# Patient Record
Sex: Male | Born: 1975 | Race: Black or African American | Hispanic: No | Marital: Married | Smoking: Current every day smoker
Health system: Southern US, Community
[De-identification: ages and names within clinical notes are randomized; demographics above are authoritative.]

## PROBLEM LIST (undated history)

## (undated) DIAGNOSIS — M7918 Myalgia, other site: Secondary | ICD-10-CM

## (undated) DIAGNOSIS — I10 Essential (primary) hypertension: Secondary | ICD-10-CM

## (undated) DIAGNOSIS — E7800 Pure hypercholesterolemia, unspecified: Secondary | ICD-10-CM

## (undated) DIAGNOSIS — E119 Type 2 diabetes mellitus without complications: Secondary | ICD-10-CM

## (undated) HISTORY — PX: WISDOM TOOTH EXTRACTION: SHX21

## (undated) HISTORY — PX: OTHER SURGICAL HISTORY: SHX169

## (undated) HISTORY — DX: Myalgia, other site: M79.18

---

## 2001-01-01 ENCOUNTER — Emergency Department (HOSPITAL_COMMUNITY): Admission: EM | Admit: 2001-01-01 | Discharge: 2001-01-01 | Payer: Self-pay | Admitting: Internal Medicine

## 2001-01-04 ENCOUNTER — Emergency Department (HOSPITAL_COMMUNITY): Admission: EM | Admit: 2001-01-04 | Discharge: 2001-01-04 | Payer: Self-pay | Admitting: Emergency Medicine

## 2001-01-09 ENCOUNTER — Emergency Department (HOSPITAL_COMMUNITY): Admission: EM | Admit: 2001-01-09 | Discharge: 2001-01-09 | Payer: Self-pay | Admitting: Emergency Medicine

## 2001-01-19 ENCOUNTER — Emergency Department (HOSPITAL_COMMUNITY): Admission: EM | Admit: 2001-01-19 | Discharge: 2001-01-19 | Payer: Self-pay | Admitting: Emergency Medicine

## 2001-01-19 ENCOUNTER — Encounter: Payer: Self-pay | Admitting: Emergency Medicine

## 2001-02-01 ENCOUNTER — Encounter: Admission: RE | Admit: 2001-02-01 | Discharge: 2001-02-01 | Payer: Self-pay | Admitting: Otolaryngology

## 2001-02-01 ENCOUNTER — Encounter: Payer: Self-pay | Admitting: Otolaryngology

## 2001-02-04 ENCOUNTER — Inpatient Hospital Stay (HOSPITAL_COMMUNITY): Admission: RE | Admit: 2001-02-04 | Discharge: 2001-02-07 | Payer: Self-pay | Admitting: Otolaryngology

## 2001-02-04 ENCOUNTER — Encounter: Payer: Self-pay | Admitting: Otolaryngology

## 2005-03-12 ENCOUNTER — Emergency Department (HOSPITAL_COMMUNITY): Admission: EM | Admit: 2005-03-12 | Discharge: 2005-03-12 | Payer: Self-pay | Admitting: Emergency Medicine

## 2005-09-05 ENCOUNTER — Emergency Department (HOSPITAL_COMMUNITY): Admission: EM | Admit: 2005-09-05 | Discharge: 2005-09-05 | Payer: Self-pay | Admitting: Emergency Medicine

## 2009-12-01 ENCOUNTER — Emergency Department (HOSPITAL_COMMUNITY): Admission: EM | Admit: 2009-12-01 | Discharge: 2009-12-01 | Payer: Self-pay | Admitting: Emergency Medicine

## 2010-05-23 ENCOUNTER — Emergency Department (HOSPITAL_COMMUNITY): Admission: EM | Admit: 2010-05-23 | Discharge: 2010-05-23 | Payer: Self-pay | Admitting: Family Medicine

## 2010-06-18 ENCOUNTER — Ambulatory Visit (HOSPITAL_COMMUNITY): Admission: RE | Admit: 2010-06-18 | Discharge: 2010-06-18 | Payer: Self-pay | Admitting: Family Medicine

## 2010-06-26 ENCOUNTER — Encounter
Admission: RE | Admit: 2010-06-26 | Discharge: 2010-08-05 | Payer: Self-pay | Source: Home / Self Care | Admitting: Internal Medicine

## 2010-11-11 ENCOUNTER — Other Ambulatory Visit (HOSPITAL_COMMUNITY): Payer: Self-pay | Admitting: Family Medicine

## 2010-11-11 DIAGNOSIS — R52 Pain, unspecified: Secondary | ICD-10-CM

## 2010-11-11 DIAGNOSIS — R262 Difficulty in walking, not elsewhere classified: Secondary | ICD-10-CM

## 2010-11-18 ENCOUNTER — Ambulatory Visit (HOSPITAL_COMMUNITY)
Admission: RE | Admit: 2010-11-18 | Discharge: 2010-11-18 | Disposition: A | Discharge status: Home / Self Care | Payer: Self-pay | Source: Ambulatory Visit | Attending: Family Medicine | Admitting: Family Medicine

## 2010-11-18 DIAGNOSIS — D1779 Benign lipomatous neoplasm of other sites: Secondary | ICD-10-CM | POA: Insufficient documentation

## 2010-11-18 DIAGNOSIS — M545 Low back pain, unspecified: Secondary | ICD-10-CM | POA: Insufficient documentation

## 2010-11-18 DIAGNOSIS — R52 Pain, unspecified: Secondary | ICD-10-CM

## 2010-11-18 DIAGNOSIS — R262 Difficulty in walking, not elsewhere classified: Secondary | ICD-10-CM

## 2010-11-18 MED ORDER — GADOBENATE DIMEGLUMINE 529 MG/ML IV SOLN
20.0000 mL | Freq: Once | INTRAVENOUS | Status: AC
  Administered 2010-11-18: 20 mL via INTRAVENOUS

## 2011-01-24 NOTE — Op Note (Signed)
[place]. [facility]  Patient:    Antonio Huber, Antonio Huber                   MRN: [id number] Proc. Date: 02/04/01 Adm. Date:  [id number] Attending:  Lucky Cowboy CC:         G[place]. Yetta Barre, M.D.   Operative Report  PREOPERATIVE DIAGNOSIS:  Right neck abscess.  POSTOPERATIVE DIAGNOSIS:  Right neck abscess.  PROCEDURE:  Incision and drainage of right neck abscess.  SURGEON:  Lucky Cowboy, M.D.  ANESTHESIA:  General endotracheal anesthesia.  ESTIMATED BLOOD LOSS:  5 cc.  SPECIMENS:  Culture from right neck abscess cavity.  COMPLICATIONS:  None.  INDICATIONS:  This patient is a 35 year old male who has been treated with antibiotics for hidradenitis suppurativa.  He has had cellulitis and microabscesses in the right neck.  Repeat CT scan revealed coalescence into a 3 cm abscess cavity.  For this reason, he is taken to the operating room for drainage.  FINDINGS:  Patient was noted to have a large abscess cavity within the right sternocleidomastoid and posterior neck.  Cultures were obtained.  DESCRIPTION OF PROCEDURE:  The patient was taken to the operating room and placed on the table in the supine position.   He was then placed under general endotracheal anesthesia and the right neck prepped with Betadine and draped in the usual sterile fashion.  A transverse 2 cm incision was made with a #15 blade.  Bovie cautery was used to divide the platysma muscle.  Blunt dissection down into the abscess cavity was performed using a hemostat.  Pus was delivered deep in the abscess cavity.  Cultures, both aerobic and anaerobic, were obtained.  A finger dissection was used to ensure continuity with the entire abscess cavity.  Copious irrigation with normal saline was performed.  A half-inch Penrose placed in the depths of the wound and secured to the skin in a simple interrupted fashion using 4-0 Prolene.  Bacitracin ointment was placed on the wound.  Next, a dressing  using Kerlix was then applied.  The patient was awakened from the anesthesia and taken to the postanesthesia care unit in stable condition.  There were no complications. DD:  02/06/01 TD:  02/06/01 Job: [id number]

## 2011-01-24 NOTE — Discharge Summary (Signed)
[place]. [facility]  Patient:    TURKI, TAPANES                   MRN: [id number] Adm. Date:  [id number] Disc. Date: [id number] Attending:  Lucky Cowboy CC:         [facility]  Nadara Mustard, M.D.   Discharge Summary  DISCHARGE DIAGNOSES: 1. Right neck abscess secondary to hydradenitis suppurativa. 2. Right knee effusion.  PROCEDURE:  Incision and drainage of right neck abscess.  Aspiration of right knee fluid.  HOSPITAL COURSE:  The patient was admitted after being treated with high dose oral antibiotic therapy and the knowledge of a phlegmon in the right posterior and midneck.  This was related to hydradenitis suppurativa.  The patient was followed in the office with repeat CT scan demonstrating well circumscribed abscess cavity.  For this reason, the patient was taken to the operating room where incision and drainage of the right neck abscess was performed.  The patient was noted to have a large abscess cavity within the right sternocleidomastoid muscle in the posterior neck.  Cultures were obtained, but did not grow bacteria.  There was prominence of white blood cells.  The patient was admitted and treated with IV Unasyn.  He was left with a drain in place to continue to drain abscess and cellulitic fluids.  The drain was removed on postoperative day #3.  There was minimal serosanguineous drainage. The patient initially did spike a postoperative fever of 101.5 which decreased on postoperative day #2.  While in the hospital, the patient developed some swelling of the right knee. He did recall a fall approximately three weeks prior.  Dr. Lajoyce Corners was consulted who performed aspiration of right knee effusion.  The white blood cell count was normal as was the differential.  The patient was discharged to home in stable condition.  DISCHARGE MEDICATIONS: 1. Augmentin 875 mg b.i.d. for seven days. 2. Darvocet-N 100 one to two  q.4-6h. p.r.n. pain.  ACTIVITY:  He was instructed to not do anything strenuous.  He was also instructed to keep the right neck incision site dry and to apply bacitracin ointment two to three times per day.  FOLLOW-UP:  He was instructed to contact Dr. Burna Sis office for redness or increase in pain in the right knee.  An appointment with Dr. Gerilyn Pilgrim in one week. DD:  02/24/01 TD:  02/24/01 Job: [id number]

## 2011-06-09 ENCOUNTER — Encounter: Payer: Self-pay | Attending: Neurosurgery | Admitting: Neurosurgery

## 2011-06-09 DIAGNOSIS — M25529 Pain in unspecified elbow: Secondary | ICD-10-CM

## 2011-06-09 DIAGNOSIS — M545 Low back pain, unspecified: Secondary | ICD-10-CM | POA: Insufficient documentation

## 2011-06-09 DIAGNOSIS — M25569 Pain in unspecified knee: Secondary | ICD-10-CM | POA: Insufficient documentation

## 2011-06-09 DIAGNOSIS — M25519 Pain in unspecified shoulder: Secondary | ICD-10-CM | POA: Insufficient documentation
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
## 2012-06-01 ENCOUNTER — Ambulatory Visit: Payer: Self-pay | Admitting: Family Medicine

## 2012-06-07 ENCOUNTER — Ambulatory Visit (INDEPENDENT_AMBULATORY_CARE_PROVIDER_SITE_OTHER): Payer: Self-pay | Admitting: Family Medicine

## 2012-06-07 VITALS — BP 141/91 | HR 102 | Temp 97.9°F | Ht 65.0 in | Wt 266.0 lb

## 2012-06-07 DIAGNOSIS — G8929 Other chronic pain: Secondary | ICD-10-CM

## 2012-06-07 DIAGNOSIS — E669 Obesity, unspecified: Secondary | ICD-10-CM | POA: Insufficient documentation

## 2012-06-07 DIAGNOSIS — R350 Frequency of micturition: Secondary | ICD-10-CM

## 2012-06-07 DIAGNOSIS — I10 Essential (primary) hypertension: Secondary | ICD-10-CM

## 2012-06-07 DIAGNOSIS — M549 Dorsalgia, unspecified: Secondary | ICD-10-CM

## 2012-06-07 DIAGNOSIS — M25519 Pain in unspecified shoulder: Secondary | ICD-10-CM

## 2012-06-07 MED ORDER — MELOXICAM 7.5 MG PO TABS: 7.5000 mg | ORAL_TABLET | Freq: Every day | ORAL | Status: DC

## 2012-06-07 MED ORDER — TRAMADOL HCL 50 MG PO TABS: 50.0000 mg | ORAL_TABLET | Freq: Three times a day (TID) | ORAL | Status: DC | PRN

## 2012-06-07 MED ORDER — CYCLOBENZAPRINE HCL 10 MG PO TABS: 10.0000 mg | ORAL_TABLET | Freq: Three times a day (TID) | ORAL | Status: DC | PRN

## 2012-06-07 NOTE — Patient Instructions (Addendum)
Thank you for coming into clinic today Please continue taking your medications as prescribed Please continue to apply for the orange card After you are approved come back for blood work and another appointment Please consider a joint injection Please consider physical therapy

## 2012-06-11 ENCOUNTER — Encounter: Payer: Self-pay | Admitting: Family Medicine

## 2012-06-11 DIAGNOSIS — M25519 Pain in unspecified shoulder: Secondary | ICD-10-CM | POA: Insufficient documentation

## 2012-06-11 NOTE — Assessment & Plan Note (Signed)
No musculoskeletal abnormality noted. Significant previous imaging WILL NOT RX CHRONIC HIGH DOSE PAIN MEDS FOR THIS CONDITION Likely secondary to obesity Flexeril adn wt loss Refill tramadol at this time due to multiple musc complaints but no vicodin

## 2012-06-11 NOTE — Assessment & Plan Note (Addendum)
Concern for Diabetes WIll obtain urine and blood work after obtaining orange card per pt decision. Warning signs of hyperglycemia/hypoglycemia reviewed CMET, UA, A1c ordered and to be done prior to next appt
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
## 2012-06-11 NOTE — Assessment & Plan Note (Signed)
Elevated today in clinic Recheck and next appt Pt to check at home

## 2012-06-11 NOTE — Assessment & Plan Note (Addendum)
Pt not exercising and w/ poor nutrition. Pt to try and make lifestyle chages Lipid panel prior to next appt

## 2012-06-11 NOTE — Assessment & Plan Note (Addendum)
No bony abnormality Imaging reviewed and noted above Pt physical exam not consistent w/ history  WILL NOT RX LONGTERM HIGH DOSE NARCOTICS Refill tramadol and meloxicam for now.  Start flexeril Will consider injection if persists Pt to start PT once has orange card.  DO NOT BELIEVE PT WOULD MEET DISABILITY DUE TO COMPLAINT

## 2014-01-20 ENCOUNTER — Emergency Department (HOSPITAL_COMMUNITY)
Admission: EM | Admit: 2014-01-20 | Discharge: 2014-01-20 | Disposition: A | Discharge status: Home / Self Care | Payer: No Typology Code available for payment source | Attending: Emergency Medicine | Admitting: Emergency Medicine

## 2014-01-20 ENCOUNTER — Encounter (HOSPITAL_COMMUNITY): Payer: Self-pay | Admitting: Emergency Medicine

## 2014-01-20 DIAGNOSIS — J351 Hypertrophy of tonsils: Secondary | ICD-10-CM | POA: Insufficient documentation

## 2014-01-20 DIAGNOSIS — T783XXA Angioneurotic edema, initial encounter: Secondary | ICD-10-CM | POA: Insufficient documentation

## 2014-01-20 DIAGNOSIS — T44905A Adverse effect of unspecified drugs primarily affecting the autonomic nervous system, initial encounter: Secondary | ICD-10-CM | POA: Insufficient documentation

## 2014-01-20 DIAGNOSIS — Z791 Long term (current) use of non-steroidal anti-inflammatories (NSAID): Secondary | ICD-10-CM | POA: Insufficient documentation

## 2014-01-20 DIAGNOSIS — F17200 Nicotine dependence, unspecified, uncomplicated: Secondary | ICD-10-CM | POA: Insufficient documentation

## 2014-01-20 LAB — RAPID STREP SCREEN (MED CTR MEBANE ONLY): Streptococcus, Group A Screen (Direct): NEGATIVE

## 2014-01-20 MED ORDER — SODIUM CHLORIDE 0.9 % IV SOLN
Freq: Once | INTRAVENOUS | Status: AC
  Administered 2014-01-20: 1000 mL/h via INTRAVENOUS

## 2014-01-20 MED ORDER — DEXAMETHASONE SODIUM PHOSPHATE 10 MG/ML IJ SOLN
10.0000 mg | Freq: Once | INTRAMUSCULAR | Status: AC
  Administered 2014-01-20: 10 mg via INTRAVENOUS
  Filled 2014-01-20: qty 1

## 2014-01-20 NOTE — ED Notes (Signed)
Medication patient takes are Meloxicam, Lisinopril, Hydrocodone - apap, and cyclobenzaprine

## 2014-01-20 NOTE — ED Notes (Addendum)
He was started on lisinopril 2 days ago. He took his dose this am at 0900 then took a nap when he woke up he felt like his "throat was swollen and closing." He states he can not clear his throat or cough. He is breathing easily on room air during triage
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
## 2014-01-20 NOTE — Discharge Instructions (Signed)
As discussed, it is important that you stop taking the lisinopril.  Followup with your physician for further evaluation and management, particularly in terms of your blood pressure issues.  You may also consider following up with physical therapy for additional assistance with your shoulder and back pain.  [facility] [phone] [address]   Angioedema Angioedema is a sudden swelling of tissues, often of the skin. It can occur on the face or genitals or in the abdomen or other body parts. The swelling usually develops over a short period and gets better in 24 to 48 hours. It often begins during the night and is found when the person wakes up. The person may also get red, itchy patches of skin (hives). Angioedema can be dangerous if it involves swelling of the air passages.  Depending on the cause, episodes of angioedema may only happen once, come back in unpredictable patterns, or repeat for several years and then gradually fade away.  CAUSES  Angioedema can be caused by an allergic reaction to various triggers. It can also result from nonallergic causes, including reactions to drugs, immune system disorders, viral infections, or an abnormal gene that is passed to you from your parents (hereditary). For some people with angioedema, the cause is unknown.  Some things that can trigger angioedema include:   Foods.   Medicines, such as ACE inhibitors, ARBs, nonsteroidal anti-inflammatory agents, or estrogen.   Latex.   Animal saliva.   Insect stings.   Dyes used in X-rays.   Mild injury.   Dental work.  Surgery.  Stress.   Sudden changes in temperature.   Exercise. SIGNS AND SYMPTOMS   Swelling of the skin.  Hives. If these are present, there is also intense itching.  Redness in the affected area.   Pain in the affected area.  Swollen lips or tongue.  Breathing problems. This may happen if the air passages swell.  Wheezing. If  internal organs are involved, there may be:   Nausea.   Abdominal pain.   Vomiting.   Difficulty swallowing.   Difficulty passing urine. DIAGNOSIS   Your health care provider will examine the affected area and take a medical and family history.  Various tests may be done to help determine the cause. Tests may include:  Allergy skin tests to see if the problem is an allergic reaction.   Blood tests to check for hereditary angioedema.   Tests to check for underlying diseases that could cause the condition.   A review of your medicines, including over the counter medicines, may be done. TREATMENT  Treatment will depend on the cause of the angioedema. Possible treatments include:   Removal of anything that triggered the condition (such as stopping certain medicines).   Medicines to treat symptoms or prevent attacks. Medicines given may include:   Antihistamines.   Epinephrine injection.   Steroids.   Hospitalization may be required for severe attacks. If the air passages are affected, it can be an emergency. Tubes may need to be placed to keep the airway open. HOME CARE INSTRUCTIONS   Only take over-the-counter or prescription medicines as directed by your health care provider.  If you were given medicines for emergency allergy treatment, always carry them with you.  Wear a medical bracelet as directed by your health care provider.   Avoid known triggers. SEEK MEDICAL CARE IF:   You have repeat attacks of angioedema.   Your attacks are more frequent or more severe despite preventive measures.  You have hereditary angioedema and are considering having children. It is important to discuss the risks of passing the condition on to your children with your health care provider. SEEK IMMEDIATE MEDICAL CARE IF:   You have severe swelling of the mouth, tongue, or lips.  You have difficulty breathing.   You have difficulty swallowing.   You  faint. MAKE SURE YOU:  Understand these instructions.  Will watch your condition.  Will get help right away if you are not doing well or get worse. Document Released: 11/03/2001 Document Revised: 06/15/2013 Document Reviewed: 04/18/2013 [facility] Patient Information 2014 [place], [place].

## 2014-01-20 NOTE — ED Notes (Signed)
Dr. Vanita Panda and RN at [facility] for team d/c. Pt alert, NAD, calm, interactive, handling secretions, no dyspnea noted, speech clear/unaltered, resps e/u, speaking in clear complete sentences, VSS. family at [facility]. Pt "ready to go".

## 2014-01-22 LAB — CULTURE, GROUP A STREP

## 2014-03-31 ENCOUNTER — Encounter (HOSPITAL_COMMUNITY): Payer: Self-pay | Admitting: Emergency Medicine

## 2014-03-31 ENCOUNTER — Emergency Department (INDEPENDENT_AMBULATORY_CARE_PROVIDER_SITE_OTHER): Payer: No Typology Code available for payment source

## 2014-03-31 ENCOUNTER — Emergency Department (HOSPITAL_COMMUNITY)
Admission: EM | Admit: 2014-03-31 | Discharge: 2014-03-31 | Disposition: A | Discharge status: Home / Self Care | Payer: Medicaid Other | Attending: Emergency Medicine | Admitting: Emergency Medicine

## 2014-03-31 ENCOUNTER — Emergency Department (INDEPENDENT_AMBULATORY_CARE_PROVIDER_SITE_OTHER)
Admission: EM | Admit: 2014-03-31 | Discharge: 2014-03-31 | Disposition: A | Discharge status: Hospice | Payer: No Typology Code available for payment source | Source: Home / Self Care | Attending: Family Medicine | Admitting: Family Medicine

## 2014-03-31 DIAGNOSIS — Z791 Long term (current) use of non-steroidal anti-inflammatories (NSAID): Secondary | ICD-10-CM | POA: Diagnosis not present

## 2014-03-31 DIAGNOSIS — R197 Diarrhea, unspecified: Secondary | ICD-10-CM | POA: Diagnosis not present

## 2014-03-31 DIAGNOSIS — E876 Hypokalemia: Secondary | ICD-10-CM

## 2014-03-31 DIAGNOSIS — R61 Generalized hyperhidrosis: Secondary | ICD-10-CM

## 2014-03-31 DIAGNOSIS — F17200 Nicotine dependence, unspecified, uncomplicated: Secondary | ICD-10-CM | POA: Insufficient documentation

## 2014-03-31 DIAGNOSIS — N390 Urinary tract infection, site not specified: Secondary | ICD-10-CM | POA: Insufficient documentation

## 2014-03-31 DIAGNOSIS — R000 Tachycardia, unspecified: Secondary | ICD-10-CM | POA: Insufficient documentation

## 2014-03-31 DIAGNOSIS — R509 Fever, unspecified: Secondary | ICD-10-CM | POA: Insufficient documentation

## 2014-03-31 DIAGNOSIS — R0602 Shortness of breath: Secondary | ICD-10-CM | POA: Diagnosis not present

## 2014-03-31 DIAGNOSIS — R51 Headache: Secondary | ICD-10-CM | POA: Insufficient documentation

## 2014-03-31 DIAGNOSIS — IMO0001 Reserved for inherently not codable concepts without codable children: Secondary | ICD-10-CM | POA: Insufficient documentation

## 2014-03-31 DIAGNOSIS — Z79899 Other long term (current) drug therapy: Secondary | ICD-10-CM | POA: Diagnosis not present

## 2014-03-31 DIAGNOSIS — E860 Dehydration: Secondary | ICD-10-CM | POA: Diagnosis not present

## 2014-03-31 DIAGNOSIS — R1110 Vomiting, unspecified: Secondary | ICD-10-CM | POA: Insufficient documentation

## 2014-03-31 LAB — COMPREHENSIVE METABOLIC PANEL
ALBUMIN: 3.1 g/dL — AB (ref 3.5–5.2)
ALK PHOS: 187 U/L — AB (ref 39–117)
ALK PHOS: 132 U/L — AB (ref 39–117)
ALT: 73 U/L — AB (ref 0–53)
ALT: 58 U/L — ABNORMAL HIGH (ref 0–53)
ANION GAP: 18 — AB (ref 5–15)
AST: 54 U/L — AB (ref 0–37)
AST: 42 U/L — ABNORMAL HIGH (ref 0–37)
Albumin: 3.8 g/dL (ref 3.5–5.2)
Anion gap: 20 — ABNORMAL HIGH (ref 5–15)
BILIRUBIN TOTAL: 1.3 mg/dL — AB (ref 0.3–1.2)
BUN: 16 mg/dL (ref 6–23)
BUN: 16 mg/dL (ref 6–23)
CALCIUM: 8.9 mg/dL (ref 8.4–10.5)
CHLORIDE: 99 meq/L (ref 96–112)
CO2: 24 meq/L (ref 19–32)
CO2: 22 meq/L (ref 19–32)
Calcium: 8.1 mg/dL — ABNORMAL LOW (ref 8.4–10.5)
Chloride: 95 mEq/L — ABNORMAL LOW (ref 96–112)
Creatinine, Ser: 1.48 mg/dL — ABNORMAL HIGH (ref 0.50–1.35)
Creatinine, Ser: 1.47 mg/dL — ABNORMAL HIGH (ref 0.50–1.35)
GFR calc Af Amer: 67 mL/min — ABNORMAL LOW (ref >90)
GFR calc Af Amer: 68 mL/min — ABNORMAL LOW (ref >90)
GFR, EST NON AFRICAN AMERICAN: 58 mL/min — AB (ref >90)
GFR, EST NON AFRICAN AMERICAN: 58 mL/min — AB (ref >90)
GLUCOSE: 137 mg/dL — AB (ref 70–99)
GLUCOSE: 147 mg/dL — AB (ref 70–99)
POTASSIUM: 3.2 meq/L — AB (ref 3.7–5.3)
Potassium: 3 mEq/L — ABNORMAL LOW (ref 3.7–5.3)
SODIUM: 139 meq/L (ref 137–147)
Sodium: 139 mEq/L (ref 137–147)
Total Bilirubin: 1.7 mg/dL — ABNORMAL HIGH (ref 0.3–1.2)
Total Protein: 8.5 g/dL — ABNORMAL HIGH (ref 6.0–8.3)
Total Protein: 7 g/dL (ref 6.0–8.3)

## 2014-03-31 LAB — POCT I-STAT, CHEM 8
BUN: 16 mg/dL (ref 6–23)
CALCIUM ION: 1.08 mmol/L — AB (ref 1.12–1.23)
CHLORIDE: 101 meq/L (ref 96–112)
CREATININE: 1.8 mg/dL — AB (ref 0.50–1.35)
GLUCOSE: 159 mg/dL — AB (ref 70–99)
HCT: 54 % — ABNORMAL HIGH (ref 39.0–52.0)
HEMOGLOBIN: 18.4 g/dL — AB (ref 13.0–17.0)
POTASSIUM: 2.7 meq/L — AB (ref 3.7–5.3)
Sodium: 140 mEq/L (ref 137–147)
TCO2: 22 mmol/L (ref 0–100)

## 2014-03-31 LAB — URINE MICROSCOPIC-ADD ON

## 2014-03-31 LAB — CBC
HCT: 48.4 % (ref 39.0–52.0)
HEMOGLOBIN: 16.7 g/dL (ref 13.0–17.0)
MCH: 30.9 pg (ref 26.0–34.0)
MCHC: 34.5 g/dL (ref 30.0–36.0)
MCV: 89.5 fL (ref 78.0–100.0)
RBC: 5.41 MIL/uL (ref 4.22–5.81)
RDW: 13 % (ref 11.5–15.5)
WBC: 9.7 10*3/uL (ref 4.0–10.5)

## 2014-03-31 LAB — CBC WITH DIFFERENTIAL/PLATELET
BASOS ABS: 0 10*3/uL (ref 0.0–0.1)
BASOS PCT: 0 % (ref 0–1)
Eosinophils Absolute: 0 10*3/uL (ref 0.0–0.7)
Eosinophils Relative: 0 % (ref 0–5)
HCT: 40.7 % (ref 39.0–52.0)
HEMOGLOBIN: 13.9 g/dL (ref 13.0–17.0)
LYMPHS ABS: 0.8 10*3/uL (ref 0.7–4.0)
Lymphocytes Relative: 7 % — ABNORMAL LOW (ref 12–46)
MCH: 30.5 pg (ref 26.0–34.0)
MCHC: 34.2 g/dL (ref 30.0–36.0)
MCV: 89.3 fL (ref 78.0–100.0)
MONOS PCT: 3 % (ref 3–12)
Monocytes Absolute: 0.4 10*3/uL (ref 0.1–1.0)
Neutro Abs: 10.9 10*3/uL — ABNORMAL HIGH (ref 1.7–7.7)
Neutrophils Relative %: 90 % — ABNORMAL HIGH (ref 43–77)
Platelets: 88 10*3/uL — ABNORMAL LOW (ref 150–400)
RBC: 4.56 MIL/uL (ref 4.22–5.81)
RDW: 13 % (ref 11.5–15.5)
WBC Morphology: INCREASED
WBC: 12.1 10*3/uL — ABNORMAL HIGH (ref 4.0–10.5)

## 2014-03-31 LAB — URINALYSIS, ROUTINE W REFLEX MICROSCOPIC
Glucose, UA: NEGATIVE mg/dL
Ketones, ur: 15 mg/dL — AB
NITRITE: NEGATIVE
PROTEIN: 30 mg/dL — AB
Specific Gravity, Urine: 1.014 (ref 1.005–1.030)
Urobilinogen, UA: 2 mg/dL — ABNORMAL HIGH (ref 0.0–1.0)
pH: 6 (ref 5.0–8.0)

## 2014-03-31 LAB — I-STAT CG4 LACTIC ACID, ED: Lactic Acid, Venous: 2.12 mmol/L (ref 0.5–2.2)

## 2014-03-31 MED ORDER — ACETAMINOPHEN 325 MG PO TABS
650.0000 mg | ORAL_TABLET | Freq: Once | ORAL | Status: AC
  Administered 2014-03-31: 650 mg via ORAL
  Filled 2014-03-31: qty 2

## 2014-03-31 MED ORDER — POTASSIUM CHLORIDE CRYS ER 20 MEQ PO TBCR
40.0000 meq | EXTENDED_RELEASE_TABLET | Freq: Once | ORAL | Status: AC
  Administered 2014-03-31: 40 meq via ORAL
  Filled 2014-03-31: qty 2

## 2014-03-31 MED ORDER — SODIUM CHLORIDE 0.9 % IV BOLUS (SEPSIS)
30.0000 mL/kg | Freq: Once | INTRAVENOUS | Status: AC
  Administered 2014-03-31: 1000 mL via INTRAVENOUS

## 2014-03-31 MED ORDER — DEXTROSE 5 % IV SOLN
1.0000 g | Freq: Once | INTRAVENOUS | Status: AC
  Administered 2014-03-31: 1 g via INTRAVENOUS
  Filled 2014-03-31: qty 10

## 2014-03-31 MED ORDER — SODIUM CHLORIDE 0.9 % IV BOLUS (SEPSIS)
1000.0000 mL | Freq: Once | INTRAVENOUS | Status: AC
  Administered 2014-03-31: 1000 mL via INTRAVENOUS

## 2014-03-31 MED ORDER — SODIUM CHLORIDE 0.9 % IV SOLN
1000.0000 mL | INTRAVENOUS | Status: DC
  Administered 2014-03-31: 1000 mL via INTRAVENOUS

## 2014-03-31 MED ORDER — CEPHALEXIN 500 MG PO CAPS: 500.0000 mg | ORAL_CAPSULE | Freq: Four times a day (QID) | ORAL | Status: DC

## 2014-03-31 NOTE — ED Provider Notes (Addendum)
Antonio Huber is a 38 y.o. male who presents to Urgent Care today for fevers chills body aches. Symptoms present for the last 3 days. This is also associated with vomiting diarrhea and mild shortness of breath. He denies any chest pains palpitations or cough. He notes excessive sweating. He's been drinking fluids. He feels well otherwise. No medications tried yet.   Past Medical History  Diagnosis Date  . Musculoskeletal pain     after multiple traumatic events   History  Substance Use Topics  . Smoking status: Current Every Day Smoker    Types: Cigarettes  . Smokeless tobacco: Not on file  . Alcohol Use: No   ROS as above Medications: No current facility-administered medications for this encounter.   Current Outpatient Prescriptions  Medication Sig Dispense Refill  . aspirin 500 MG EC tablet Take 500 mg by mouth every 6 (six) hours as needed for pain (Back and Body Pain).      . cyclobenzaprine (FLEXERIL) 10 MG tablet Take 1 tablet (10 mg total) by mouth 3 (three) times daily as needed for muscle spasms.  30 tablet  0  . cyclobenzaprine (FLEXERIL) 10 MG tablet Take 10 mg by mouth 2 (two) times daily.      Marland Kitchen HYDROcodone-acetaminophen (NORCO/VICODIN) 5-325 MG per tablet Take 1 tablet by mouth every 6 (six) hours as needed for moderate pain.      . meloxicam (MOBIC) 7.5 MG tablet Take 7.5 mg by mouth daily.        Exam:  Pulse 144  Temp(Src) 99.5 F (37.5 C) (Oral)  Resp 26  SpO2 94%  Orthostatic Lying - BP- Lying: 124/74 mmHg ; Pulse- Lying: 144  Orthostatic Sitting - BP- Sitting: 104/73 mmHg ; Pulse- Sitting: 151  Orthostatic Standing at 0 minutes - BP- Standing at 0 minutes: 139/91 mmHg ; Pulse- Standing at 0 minutes: 156  Gen: Diaphoretic into. HEENT: EOMI,  MMM Lungs: Normal work of breathing. CTABL Heart: Tachycardia but regular no MRG Abd: NABS, Soft. Nondistended, Nontender Exts: Brisk capillary refill, warm and well perfused. Nonedematous bilateral lower  extremities  Twelve-lead EKG shows tachycardia at 154 beats per minute. P waves are present. Sinus tachycardia versus SVT no ST segment elevation or depression.  Results for orders placed during the hospital encounter of 03/31/14 (from the past 24 hour(s))  POCT I-STAT, CHEM 8     Status: Abnormal   Collection Time    03/31/14 10:58 AM      Result Value Ref Range   Sodium 140  137 - 147 mEq/L   Potassium 2.7 (*) 3.7 - 5.3 mEq/L   Chloride 101  96 - 112 mEq/L   BUN 16  6 - 23 mg/dL   Creatinine, Ser 1.80 (*) 0.50 - 1.35 mg/dL   Glucose, Bld 159 (*) 70 - 99 mg/dL   Calcium, Ion 1.08 (*) 1.12 - 1.23 mmol/L   TCO2 22  0 - 100 mmol/L   Hemoglobin 18.4 (*) 13.0 - 17.0 g/dL   HCT 54.0 (*) 39.0 - 52.0 %   Comment NOTIFIED PHYSICIAN     Dg Chest 2 View  03/31/2014   CLINICAL DATA:  38 year old male shortness of breath chills and diaphoresis. Initial encounter.  EXAM: CHEST  2 VIEW  COMPARISON:  None.  FINDINGS: Mildly low lung volumes. Mild basilar crowding of lung markings. No pneumothorax, pulmonary edema, pleural effusion, or confluent pulmonary opacity. Normal cardiac size and mediastinal contours. Visualized tracheal air column is within normal limits. No  osseous abnormality identified.  IMPRESSION: Mildly low lung volumes, otherwise negative.   Electronically Signed   By: Lars Pinks M.D.   On: 03/31/2014 10:42    Assessment and Plan: 37 y.o. male with diaphoresis tachycardia and tachypnea. Unclear etiology. EKG appears to be sinus tachycardia however may be SVT. Plan to transfer patient to the emergency room via EMS. IVs started and patient was placed on oxygen. CBC, CMP are pending.    Discussed warning signs or symptoms. Please see discharge instructions. Patient expresses understanding.   This note was created using Systems analyst. Any transcription errors are unintended.    Gregor Hams, MD 03/31/14 [place] [name], MD 03/31/14 [id number]

## 2014-03-31 NOTE — ED Provider Notes (Signed)
CSN: [id number]     Arrival date & time 03/31/14  1123 History   First MD Initiated Contact with Patient 03/31/14 1124     Chief Complaint  Patient presents with  . Shortness of Breath  . Tachycardia  . Fever      Patient is a 38 y.o. male presenting with diarrhea. The history is provided by the patient.  Diarrhea Quality:  Watery Severity:  Severe Onset quality:  Gradual Duration:  3 days Timing:  Intermittent Progression:  Worsening Relieved by:  Nothing Worsened by:  Nothing tried Associated symptoms: chills, diaphoresis, fever, headaches, myalgias and vomiting   Associated symptoms: no abdominal pain and no recent cough   Risk factors: no sick contacts and no travel to endemic areas   PT reports over 3 days ago he had 2 episodes of vomiting and has had multiple episodes of nonbloody diarrhea for past 3 days.  He reports up to 4 episodes/day No abd pain No cough No CP No dysuria No back pain No syncope He reports mild HA No travel No tick bites reported He reports mild SOB.  He reports chills and sweats for past 3 days   Past Medical History  Diagnosis Date  . Musculoskeletal pain     after multiple traumatic events   History reviewed. No pertinent past surgical history. Family History  Problem Relation Age of Onset  . Cancer Maternal Grandmother     breast  . Carpal tunnel syndrome Sister    History  Substance Use Topics  . Smoking status: Current Every Day Smoker    Types: Cigarettes  . Smokeless tobacco: Not on file  . Alcohol Use: No    Review of Systems  Constitutional: Positive for fever, chills and diaphoresis.  Respiratory: Negative for cough.   Cardiovascular: Negative for chest pain.  Gastrointestinal: Positive for vomiting and diarrhea. Negative for abdominal pain and blood in stool.  Musculoskeletal: Positive for myalgias.  Neurological: Positive for headaches.  All other systems reviewed and are negative.     Allergies   Lisinopril  Home Medications   Prior to Admission medications   Medication Sig Start Date End Date Taking? Authorizing Provider  aspirin 500 MG EC tablet Take 1,000 mg by mouth every 6 (six) hours as needed for pain (Back and Body Pain).    Yes Historical Provider, MD  bismuth subsalicylate (PEPTO BISMOL) 262 MG chewable tablet Chew 524 mg by mouth as needed for indigestion.   Yes Historical Provider, MD  cyclobenzaprine (FLEXERIL) 10 MG tablet Take 10 mg by mouth 2 (two) times daily.   Yes Historical Provider, MD  HYDROcodone-acetaminophen (NORCO/VICODIN) 5-325 MG per tablet Take 1 tablet by mouth 2 (two) times daily as needed for moderate pain.    Yes Historical Provider, MD  meloxicam (MOBIC) 7.5 MG tablet Take 7.5 mg by mouth daily.   Yes Historical Provider, MD  triamterene-hydrochlorothiazide (MAXZIDE-25) 37.5-25 MG per tablet Take 1 tablet by mouth daily.   Yes Historical Provider, MD   BP 101/76  Temp(Src) 101.2 F (38.4 C) (Rectal)  Resp 23  SpO2 99% Physical Exam CONSTITUTIONAL: Well developed/well nourished. Pt is diaphoretic HEAD: Normocephalic/atraumatic EYES: EOMI/PERRL, no icterus ENMT: Mucous membranes moist NECK: supple no meningeal signs SPINE:entire spine nontender CV: S1/S2 noted, no murmurs/rubs/gallops noted LUNGS: Lungs are clear to auscultation bilaterally, no apparent distress ABDOMEN: soft, nontender, no rebound or guarding GU:no cva tenderness NEURO: Pt is awake/alert, moves all extremitiesx4 EXTREMITIES: pulses normal, full ROM SKIN: warm, color normal  PSYCH: no abnormalities of mood noted  ED Course  Procedures 12:49 PM Pt sent from urgent care for vomiting/diarrhea/tachycardic He denies cp.  I doubt acute PE at this time CXR reviewed and negative IV fluids ordered I suspect HR/symptoms will improve with IV fluids and APAP.  Did not call code sepsis as of yet.   1:41 PM Vitals improved Pt reports improvement Labs pending  Pt improved at  time of discharge He was ambulatory, no distress, no hypoxia noted Hr improved He had no pain complaints He requested d/c home UTI noted Urine culture sent Rocephin ordered Advised need for followup Labs Review Labs Reviewed  CBC WITH DIFFERENTIAL - Abnormal; Notable for the following:    WBC 12.1 (*)    Platelets 88 (*)    Neutrophils Relative % 90 (*)    Lymphocytes Relative 7 (*)    Neutro Abs 10.9 (*)    All other components within normal limits  COMPREHENSIVE METABOLIC PANEL - Abnormal; Notable for the following:    Potassium 3.0 (*)    Glucose, Bld 147 (*)    Creatinine, Ser 1.47 (*)    Calcium 8.1 (*)    Albumin 3.1 (*)    AST 42 (*)    ALT 58 (*)    Alkaline Phosphatase 132 (*)    Total Bilirubin 1.3 (*)    GFR calc non Af Amer 58 (*)    GFR calc Af Amer 68 (*)    Anion gap 18 (*)    All other components within normal limits  URINALYSIS, ROUTINE W REFLEX MICROSCOPIC - Abnormal; Notable for the following:    Color, Urine AMBER (*)    APPearance CLOUDY (*)    Hgb urine dipstick MODERATE (*)    Bilirubin Urine SMALL (*)    Ketones, ur 15 (*)    Protein, ur 30 (*)    Urobilinogen, UA 2.0 (*)    Leukocytes, UA LARGE (*)    All other components within normal limits  URINE MICROSCOPIC-ADD ON - Abnormal; Notable for the following:    Squamous Epithelial / LPF FEW (*)    Bacteria, UA FEW (*)    Casts GRANULAR CAST (*)    All other components within normal limits  CULTURE, BLOOD (ROUTINE X 2)  CULTURE, BLOOD (ROUTINE X 2)  URINE CULTURE  I-STAT CG4 LACTIC ACID, ED    Imaging Review Dg Chest 2 View  03/31/2014   CLINICAL DATA:  38 year old male shortness of breath chills and diaphoresis. Initial encounter.  EXAM: CHEST  2 VIEW  COMPARISON:  None.  FINDINGS: Mildly low lung volumes. Mild basilar crowding of lung markings. No pneumothorax, pulmonary edema, pleural effusion, or confluent pulmonary opacity. Normal cardiac size and mediastinal contours. Visualized  tracheal air column is within normal limits. No osseous abnormality identified.  IMPRESSION: Mildly low lung volumes, otherwise negative.   Electronically Signed   By: Lars Pinks M.D.   On: 03/31/2014 10:42     EKG Interpretation   Date/Time:  Friday March 31 2014 11:36:28 EDT Ventricular Rate:  132 PR Interval:  61 QRS Duration: 101 QT Interval:  383 QTC Calculation: 568 R Axis:   83 Text Interpretation:  Sinus tachycardia Anteroseptal infarct, old Repol  abnrm suggests ischemia, diffuse leads Minimal ST elevation, lateral leads  Prolonged QT interval Confirmed by Christy Gentles  MD, Catrina Fellenz ([id number]) on  03/31/2014 11:53:13 AM      MDM   Final diagnoses:  Dehydration  UTI (lower urinary tract infection)  Vomiting and diarrhea    Nursing notes including past medical history and social history reviewed and considered in documentation Labs/vital reviewed and considered Previous records reviewed and considered     Sharyon Cable, MD 03/31/14 1542

## 2014-03-31 NOTE — Discharge Planning (Signed)
[facility] Liaison  Patient is a current orange Conservation officer, historic buildings and established as a patient at [facility] [place]. Pt states he has an upcoming appointment with his pcp July 28,2015. Patient expressed no concerns with his pcp or orange card at this time. [facility] case manager will follow up with the pt once discharged. My contact information provided for any future questions or concerns. No other community liaison needs identified.

## 2014-03-31 NOTE — ED Notes (Signed)
Dr.Wickline shown results of Lactic Acid. ED-Lab.

## 2014-03-31 NOTE — ED Notes (Signed)
C/o cold sx States body pain, chills/sweats, diarrhea and headache otc meds and saltine crackers Patient came in sweating

## 2014-03-31 NOTE — ED Notes (Signed)
38 yo male from [facility] with c/o N/V/D, low grade Fever, SOB and Tachycardic at 158 bpm. Reports trying home remedies but today has been diaphoretic since 0633. Denies Chest pain. IV started with UCC 18g with 1L bolus given. Pt is Clammy and cold to touch, currently diaphoretic.  Vitals 100/60 HR 134 Sat 98% RA

## 2014-04-02 LAB — URINE CULTURE: Colony Count: 50000

## 2014-04-03 ENCOUNTER — Telehealth (HOSPITAL_BASED_OUTPATIENT_CLINIC_OR_DEPARTMENT_OTHER): Payer: Self-pay

## 2014-04-03 NOTE — Telephone Encounter (Signed)
Post ED Visit - Positive Culture Follow-up  Culture report reviewed by antimicrobial stewardship pharmacist: []  Wes [place], Pharm.D., BCPS [x]  Heide Guile, Pharm.D., BCPS []  Alycia Rossetti, Pharm.D., BCPS []  [place], Pharm.D., BCPS, AAHIVP []  Legrand Como, Pharm.D., BCPS, AAHIVP []    Positive Urine culture, 50,000 colonies -> E Coli Treated with Keflex, organism sensitive to the same and no further patient follow-up is required at this time.  Dortha Kern 04/03/2014, 9:28 PM

## 2014-04-05 ENCOUNTER — Telehealth (HOSPITAL_BASED_OUTPATIENT_CLINIC_OR_DEPARTMENT_OTHER): Payer: Self-pay

## 2014-04-05 NOTE — Telephone Encounter (Signed)
Call from [facility] w/(+) Prelimanary Bld Cx 1 of 4 bottles (Aerobic bottle) for gram (+) rods.  Chart to MD for review.

## 2014-04-05 NOTE — Telephone Encounter (Signed)
Chart reviewed by Alvera Singh "Plan discussed with Dr Mingo Amber.  Would have pt return for addl set of cx.  If well appearing send home .   If doing poorly admit"  04/05/2014 @18 :56 LVM for pt to return call.

## 2014-04-06 ENCOUNTER — Encounter (HOSPITAL_COMMUNITY): Payer: Self-pay | Admitting: Emergency Medicine

## 2014-04-06 ENCOUNTER — Emergency Department (HOSPITAL_COMMUNITY)
Admission: EM | Admit: 2014-04-06 | Discharge: 2014-04-07 | Disposition: A | Discharge status: Home / Self Care | Payer: Medicaid Other | Attending: Emergency Medicine | Admitting: Emergency Medicine

## 2014-04-06 DIAGNOSIS — Z7982 Long term (current) use of aspirin: Secondary | ICD-10-CM | POA: Insufficient documentation

## 2014-04-06 DIAGNOSIS — R6889 Other general symptoms and signs: Secondary | ICD-10-CM | POA: Diagnosis present

## 2014-04-06 DIAGNOSIS — Z87440 Personal history of urinary (tract) infections: Secondary | ICD-10-CM | POA: Diagnosis not present

## 2014-04-06 DIAGNOSIS — Z79899 Other long term (current) drug therapy: Secondary | ICD-10-CM | POA: Diagnosis not present

## 2014-04-06 DIAGNOSIS — F17200 Nicotine dependence, unspecified, uncomplicated: Secondary | ICD-10-CM | POA: Diagnosis not present

## 2014-04-06 DIAGNOSIS — R7881 Bacteremia: Secondary | ICD-10-CM

## 2014-04-06 DIAGNOSIS — Z792 Long term (current) use of antibiotics: Secondary | ICD-10-CM | POA: Diagnosis not present

## 2014-04-06 DIAGNOSIS — R895 Abnormal microbiological findings in specimens from other organs, systems and tissues: Secondary | ICD-10-CM | POA: Diagnosis not present

## 2014-04-06 LAB — COMPREHENSIVE METABOLIC PANEL
ALK PHOS: 104 U/L (ref 39–117)
ALT: 50 U/L (ref 0–53)
AST: 33 U/L (ref 0–37)
Albumin: 3.5 g/dL (ref 3.5–5.2)
Anion gap: 14 (ref 5–15)
BUN: 7 mg/dL (ref 6–23)
CALCIUM: 8.6 mg/dL (ref 8.4–10.5)
CO2: 24 meq/L (ref 19–32)
Chloride: 103 mEq/L (ref 96–112)
Creatinine, Ser: 0.84 mg/dL (ref 0.50–1.35)
GLUCOSE: 116 mg/dL — AB (ref 70–99)
Potassium: 3.4 mEq/L — ABNORMAL LOW (ref 3.7–5.3)
Sodium: 141 mEq/L (ref 137–147)
Total Bilirubin: 0.4 mg/dL (ref 0.3–1.2)
Total Protein: 7.6 g/dL (ref 6.0–8.3)

## 2014-04-06 LAB — CBC WITH DIFFERENTIAL/PLATELET
BASOS ABS: 0.1 10*3/uL (ref 0.0–0.1)
BASOS PCT: 1 % (ref 0–1)
Eosinophils Absolute: 0.4 10*3/uL (ref 0.0–0.7)
Eosinophils Relative: 5 % (ref 0–5)
HEMATOCRIT: 42.5 % (ref 39.0–52.0)
HEMOGLOBIN: 14.7 g/dL (ref 13.0–17.0)
LYMPHS ABS: 2.5 10*3/uL (ref 0.7–4.0)
LYMPHS PCT: 29 % (ref 12–46)
MCH: 31.1 pg (ref 26.0–34.0)
MCHC: 34.6 g/dL (ref 30.0–36.0)
MCV: 89.9 fL (ref 78.0–100.0)
MONOS PCT: 6 % (ref 3–12)
Monocytes Absolute: 0.5 10*3/uL (ref 0.1–1.0)
NEUTROS ABS: 5.2 10*3/uL (ref 1.7–7.7)
Neutrophils Relative %: 59 % (ref 43–77)
Platelets: 353 10*3/uL (ref 150–400)
RBC: 4.73 MIL/uL (ref 4.22–5.81)
RDW: 13 % (ref 11.5–15.5)
WBC: 8.7 10*3/uL (ref 4.0–10.5)

## 2014-04-06 LAB — CULTURE, BLOOD (ROUTINE X 2): CULTURE: NO GROWTH

## 2014-04-06 LAB — URINALYSIS, ROUTINE W REFLEX MICROSCOPIC
BILIRUBIN URINE: NEGATIVE
GLUCOSE, UA: NEGATIVE mg/dL
Hgb urine dipstick: NEGATIVE
KETONES UR: NEGATIVE mg/dL
Leukocytes, UA: NEGATIVE
Nitrite: NEGATIVE
Protein, ur: NEGATIVE mg/dL
Specific Gravity, Urine: 1.019 (ref 1.005–1.030)
UROBILINOGEN UA: 0.2 mg/dL (ref 0.0–1.0)
pH: 6 (ref 5.0–8.0)

## 2014-04-06 NOTE — ED Notes (Addendum)
Pt states that he was seen and treated for an infection on Friday. Pt states that he started feeling bad July 21st. Had blood cultures taken and was called to come in because his blood cultures were positive. (from previous records it looks like patient had a bladder infection, given keflex that he is taking but has not finished.)

## 2014-04-06 NOTE — ED Provider Notes (Signed)
CSN: [id number]     Arrival date & time 04/06/14  1905 History   None    Chief Complaint  Patient presents with  . Abnormal Lab  . Blood Infection     (Consider location/radiation/quality/duration/timing/severity/associated sxs/prior Treatment) The history is provided by the patient.   38 year old male was called back to the ED because of a positive blood culture. He had been seen in the ED 6 days ago and diagnosed with urinary tract infection and sent home with a prescription for cephalexin. His urinary symptoms have improved dramatically. He also had been having diarrhea and nausea which have improved as well. He got a call today to coming to the ED because a blood culture was positive. He denies fever or chills. He has chronic musculoskeletal pain which is unchanged from his baseline and no other pains.  Past Medical History  Diagnosis Date  . Musculoskeletal pain     after multiple traumatic events   History reviewed. No pertinent past surgical history. Family History  Problem Relation Age of Onset  . Cancer Maternal Grandmother     breast  . Carpal tunnel syndrome Sister    History  Substance Use Topics  . Smoking status: Current Every Day Smoker    Types: Cigarettes  . Smokeless tobacco: Not on file  . Alcohol Use: No    Review of Systems  All other systems reviewed and are negative.     Allergies  Lisinopril  Home Medications   Prior to Admission medications   Medication Sig Start Date End Date Taking? Authorizing Provider  aspirin 500 MG EC tablet Take 1,000 mg by mouth every 6 (six) hours as needed for pain (Back and Body Pain).    Yes Historical Provider, MD  cephALEXin (KEFLEX) 500 MG capsule Take 1 capsule (500 mg total) by mouth 4 (four) times daily. 03/31/14  Yes Sharyon Cable, MD  HYDROcodone-acetaminophen (NORCO/VICODIN) 5-325 MG per tablet Take 1 tablet by mouth 2 (two) times daily as needed for moderate pain.    Yes Historical Provider, MD   meloxicam (MOBIC) 7.5 MG tablet Take 7.5 mg by mouth daily.   Yes Historical Provider, MD  triamterene-hydrochlorothiazide (MAXZIDE-25) 37.5-25 MG per tablet Take 1 tablet by mouth daily.   Yes Historical Provider, MD   BP 135/86  Pulse 85  Temp(Src) 98.4 F (36.9 C) (Oral)  Resp 16  Ht 5\' 5"  (1.651 m)  Wt 260 lb (117.935 kg)  BMI 43.27 kg/m2  SpO2 99% Physical Exam  Nursing note and vitals reviewed.  38 year old male, resting comfortably and in no acute distress. Vital signs are normal. Oxygen saturation is 99%, which is normal. Head is normocephalic and atraumatic. PERRLA, EOMI. Oropharynx is clear. Neck is nontender and supple without adenopathy or JVD. Back is nontender and there is no CVA tenderness. Lungs are clear without rales, wheezes, or rhonchi. Chest is nontender. Heart has regular rate and rhythm without murmur. Abdomen is soft, flat, nontender without masses or hepatosplenomegaly and peristalsis is normoactive. Extremities have no cyanosis or edema, full range of motion is present. Skin is warm and dry without rash. Neurologic: Mental status is normal, cranial nerves are intact, there are no motor or sensory deficits.  ED Course  Procedures (including critical care time) Labs Review Results for orders placed during the hospital encounter of 04/06/14  CBC WITH DIFFERENTIAL      Result Value Ref Range   WBC 8.7  4.0 - 10.5 K/uL   RBC 4.73  4.22 - 5.81 MIL/uL   Hemoglobin 14.7  13.0 - 17.0 g/dL   HCT 42.5  39.0 - 52.0 %   MCV 89.9  78.0 - 100.0 fL   MCH 31.1  26.0 - 34.0 pg   MCHC 34.6  30.0 - 36.0 g/dL   RDW 13.0  11.5 - 15.5 %   Platelets 353  150 - 400 K/uL   Neutrophils Relative % 59  43 - 77 %   Lymphocytes Relative 29  12 - 46 %   Monocytes Relative 6  3 - 12 %   Eosinophils Relative 5  0 - 5 %   Basophils Relative 1  0 - 1 %   Neutro Abs 5.2  1.7 - 7.7 K/uL   Lymphs Abs 2.5  0.7 - 4.0 K/uL   Monocytes Absolute 0.5  0.1 - 1.0 K/uL   Eosinophils  Absolute 0.4  0.0 - 0.7 K/uL   Basophils Absolute 0.1  0.0 - 0.1 K/uL   RBC Morphology POLYCHROMASIA PRESENT     WBC Morphology TOXIC GRANULATION     Smear Review LARGE PLATELETS PRESENT    COMPREHENSIVE METABOLIC PANEL      Result Value Ref Range   Sodium 141  137 - 147 mEq/L   Potassium 3.4 (*) 3.7 - 5.3 mEq/L   Chloride 103  96 - 112 mEq/L   CO2 24  19 - 32 mEq/L   Glucose, Bld 116 (*) 70 - 99 mg/dL   BUN 7  6 - 23 mg/dL   Creatinine, Ser 0.84  0.50 - 1.35 mg/dL   Calcium 8.6  8.4 - 10.5 mg/dL   Total Protein 7.6  6.0 - 8.3 g/dL   Albumin 3.5  3.5 - 5.2 g/dL   AST 33  0 - 37 U/L   ALT 50  0 - 53 U/L   Alkaline Phosphatase 104  39 - 117 U/L   Total Bilirubin 0.4  0.3 - 1.2 mg/dL   GFR calc non Af Amer >90  >90 mL/min   GFR calc Af Amer >90  >90 mL/min   Anion gap 14  5 - 15  URINALYSIS, ROUTINE W REFLEX MICROSCOPIC      Result Value Ref Range   Color, Urine YELLOW  YELLOW   APPearance CLEAR  CLEAR   Specific Gravity, Urine 1.019  1.005 - 1.030   pH 6.0  5.0 - 8.0   Glucose, UA NEGATIVE  NEGATIVE mg/dL   Hgb urine dipstick NEGATIVE  NEGATIVE   Bilirubin Urine NEGATIVE  NEGATIVE   Ketones, ur NEGATIVE  NEGATIVE mg/dL   Protein, ur NEGATIVE  NEGATIVE mg/dL   Urobilinogen, UA 0.2  0.0 - 1.0 mg/dL   Nitrite NEGATIVE  NEGATIVE   Leukocytes, UA NEGATIVE  NEGATIVE   MDM   Final diagnoses:  Positive blood culture    Positive blood culture. Old records are reviewed and he had one out of two bottles grow gram-positive rods. He did have a urinary tract infection with Escherichia coli which was sensitive to all tested antibiotics. He is clinically doing well with no evidence of bacteremia. WBC has normalized and left shift is no longer present. Presence of toxic granulation is noted and of uncertain significance. Additional blood cultures were drawn and he is discharged with instructions to continue on his current antibiotic until it is completely gone.    Delora Fuel,  MD 06/24/50 0258

## 2014-04-06 NOTE — Discharge Instructions (Signed)
Continue taking your cephalexin (Keflex) until all the pills are gone. Return if you are having any problems.

## 2014-04-07 LAB — CULTURE, BLOOD (ROUTINE X 2)

## 2014-04-13 LAB — CULTURE, BLOOD (ROUTINE X 2)
CULTURE: NO GROWTH
Culture: NO GROWTH

## 2014-06-10 ENCOUNTER — Emergency Department (HOSPITAL_COMMUNITY)
Admission: EM | Admit: 2014-06-10 | Discharge: 2014-06-11 | Disposition: A | Discharge status: Home / Self Care | Payer: Medicaid Other | Attending: Emergency Medicine | Admitting: Emergency Medicine

## 2014-06-10 ENCOUNTER — Encounter (HOSPITAL_COMMUNITY): Payer: Self-pay | Admitting: Emergency Medicine

## 2014-06-10 DIAGNOSIS — Z720 Tobacco use: Secondary | ICD-10-CM | POA: Diagnosis not present

## 2014-06-10 DIAGNOSIS — R000 Tachycardia, unspecified: Secondary | ICD-10-CM | POA: Diagnosis not present

## 2014-06-10 DIAGNOSIS — Z791 Long term (current) use of non-steroidal anti-inflammatories (NSAID): Secondary | ICD-10-CM | POA: Diagnosis not present

## 2014-06-10 DIAGNOSIS — J020 Streptococcal pharyngitis: Secondary | ICD-10-CM | POA: Diagnosis not present

## 2014-06-10 DIAGNOSIS — R5383 Other fatigue: Secondary | ICD-10-CM | POA: Diagnosis not present

## 2014-06-10 DIAGNOSIS — R509 Fever, unspecified: Secondary | ICD-10-CM

## 2014-06-10 DIAGNOSIS — Z79899 Other long term (current) drug therapy: Secondary | ICD-10-CM | POA: Diagnosis not present

## 2014-06-10 DIAGNOSIS — R51 Headache: Secondary | ICD-10-CM | POA: Diagnosis not present

## 2014-06-10 DIAGNOSIS — J029 Acute pharyngitis, unspecified: Secondary | ICD-10-CM | POA: Diagnosis present

## 2014-06-10 LAB — RAPID STREP SCREEN (MED CTR MEBANE ONLY): Streptococcus, Group A Screen (Direct): POSITIVE — AB

## 2014-06-10 MED ORDER — DEXAMETHASONE SODIUM PHOSPHATE 10 MG/ML IJ SOLN
10.0000 mg | Freq: Once | INTRAMUSCULAR | Status: AC
  Administered 2014-06-10: 10 mg via INTRAVENOUS
  Filled 2014-06-10: qty 1

## 2014-06-10 MED ORDER — SODIUM CHLORIDE 0.9 % IV BOLUS (SEPSIS)
1000.0000 mL | Freq: Once | INTRAVENOUS | Status: AC
  Administered 2014-06-10: 1000 mL via INTRAVENOUS

## 2014-06-10 MED ORDER — HYDROCODONE-ACETAMINOPHEN 5-325 MG PO TABS
2.0000 | ORAL_TABLET | Freq: Once | ORAL | Status: AC
  Administered 2014-06-11: 2 via ORAL
  Filled 2014-06-10: qty 2

## 2014-06-10 MED ORDER — PENICILLIN G BENZATHINE 1200000 UNIT/2ML IM SUSP
1.2000 10*6.[IU] | Freq: Once | INTRAMUSCULAR | Status: AC
  Administered 2014-06-10: 1.2 10*6.[IU] via INTRAMUSCULAR
  Filled 2014-06-10: qty 2

## 2014-06-10 MED ORDER — SODIUM CHLORIDE 0.9 % IV BOLUS (SEPSIS)
1000.0000 mL | Freq: Once | INTRAVENOUS | Status: AC
  Administered 2014-06-11: 1000 mL via INTRAVENOUS

## 2014-06-10 MED ORDER — IBUPROFEN 800 MG PO TABS
800.0000 mg | ORAL_TABLET | Freq: Once | ORAL | Status: AC
  Administered 2014-06-10: 800 mg via ORAL
  Filled 2014-06-10: qty 1

## 2014-06-10 NOTE — ED Notes (Signed)
The pt has multiple complaints he has a cold he has a headache he has cold chills.  He has allergies.  No n or vomiting
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
## 2014-06-11 MED ORDER — DIAZEPAM 5 MG/ML IJ SOLN
5.0000 mg | Freq: Once | INTRAMUSCULAR | Status: AC
  Administered 2014-06-11: 5 mg via INTRAVENOUS
  Filled 2014-06-11: qty 2

## 2014-06-11 NOTE — ED Provider Notes (Signed)
Medical screening examination/treatment/procedure(s) were performed by non-physician practitioner and as supervising physician I was immediately available for consultation/collaboration.   EKG Interpretation None        Fredia Sorrow, MD 06/11/14 1353

## 2014-06-11 NOTE — Discharge Instructions (Signed)
1. Medications: usual home medications 2. Treatment: rest, drink plenty of fluids,  3. Follow Up: Please followup with your primary doctor for discussion of your diagnoses and further evaluation after today's visit; if you do not have a primary care doctor use the resource guide provided to find one;   Strep Throat Strep throat is an infection of the throat caused by a bacteria named Streptococcus pyogenes. Your health care provider may call the infection streptococcal "tonsillitis" or "pharyngitis" depending on whether there are signs of inflammation in the tonsils or back of the throat. Strep throat is most common in children aged 5-15 years during the cold months of the year, but it can occur in people of any age during any season. This infection is spread from person to person (contagious) through coughing, sneezing, or other close contact. SIGNS AND SYMPTOMS   Fever or chills.  Painful, swollen, red tonsils or throat.  Pain or difficulty when swallowing.  White or yellow spots on the tonsils or throat.  Swollen, tender lymph nodes or "glands" of the neck or under the jaw.  Red rash all over the body (rare). DIAGNOSIS  Many different infections can cause the same symptoms. A test must be done to confirm the diagnosis so the right treatment can be given. A "rapid strep test" can help your health care provider make the diagnosis in a few minutes. If this test is not available, a light swab of the infected area can be used for a throat culture test. If a throat culture test is done, results are usually available in a day or two. TREATMENT  Strep throat is treated with antibiotic medicine. HOME CARE INSTRUCTIONS   Gargle with 1 tsp of salt in 1 cup of warm water, 3-4 times per day or as needed for comfort.  Family members who also have a sore throat or fever should be tested for strep throat and treated with antibiotics if they have the strep infection.  Make sure everyone in your  household washes their hands well.  Do not share food, drinking cups, or personal items that could cause the infection to spread to others.  You may need to eat a soft food diet until your sore throat gets better.  Drink enough water and fluids to keep your urine clear or pale yellow. This will help prevent dehydration.  Get plenty of rest.  Stay home from school, day care, or work until you have been on antibiotics for 24 hours.  Take medicines only as directed by your health care provider.  Take your antibiotic medicine as directed by your health care provider. Finish it even if you start to feel better. SEEK MEDICAL CARE IF:   The glands in your neck continue to enlarge.  You develop a rash, cough, or earache.  You cough up green, yellow-brown, or bloody sputum.  You have pain or discomfort not controlled by medicines.  Your problems seem to be getting worse rather than better.  You have a fever. SEEK IMMEDIATE MEDICAL CARE IF:   You develop any new symptoms such as vomiting, severe headache, stiff or painful neck, chest pain, shortness of breath, or trouble swallowing.  You develop severe throat pain, drooling, or changes in your voice.  You develop swelling of the neck, or the skin on the neck becomes red and tender.  You develop signs of dehydration, such as fatigue, dry mouth, and decreased urination.  You become increasingly sleepy, or you cannot wake up completely. MAKE SURE  YOU:  Understand these instructions.  Will watch your condition.  Will get help right away if you are not doing well or get worse. Document Released: 08/22/2000 Document Revised: 01/09/2014 Document Reviewed: 10/24/2010 [facility] Patient Information 2015 [place], [place]. This information is not intended to replace advice given to you by your health care provider. Make sure you discuss any questions you have with your health care provider.    Fever, Adult A fever is a higher than normal  body temperature. In an adult, an oral temperature around 98.6 F (37 C) is considered normal. A temperature of 100.4 F (38 C) or higher is generally considered a fever. Mild or moderate fevers generally have no long-term effects and often do not require treatment. Extreme fever (greater than or equal to 106 F or 41.1 C) can cause seizures. The sweating that may occur with repeated or prolonged fever may cause dehydration. Elderly people can develop confusion during a fever. A measured temperature can vary with:  Age.  Time of day.  Method of measurement (mouth, underarm, rectal, or ear). The fever is confirmed by taking a temperature with a thermometer. Temperatures can be taken different ways. Some methods are accurate and some are not.  An oral temperature is used most commonly. Electronic thermometers are fast and accurate.  An ear temperature will only be accurate if the thermometer is positioned as recommended by the manufacturer.  A rectal temperature is accurate and done for those adults who have a condition where an oral temperature cannot be taken.  An underarm (axillary) temperature is not accurate and not recommended. Fever is a symptom, not a disease.  CAUSES   Infections commonly cause fever.  Some noninfectious causes for fever include:  Some arthritis conditions.  Some thyroid or adrenal gland conditions.  Some immune system conditions.  Some types of cancer.  A medicine reaction.  High doses of certain street drugs such as methamphetamine.  Dehydration.  Exposure to high outside or room temperatures.  Occasionally, the source of a fever cannot be determined. This is sometimes called a "fever of unknown origin" (FUO).  Some situations may lead to a temporary rise in body temperature that may go away on its own. Examples are:  Childbirth.  Surgery.  Intense exercise. HOME CARE INSTRUCTIONS   Take appropriate medicines for fever. Follow dosing  instructions carefully. If you use acetaminophen to reduce the fever, be careful to avoid taking other medicines that also contain acetaminophen. Do not take aspirin for a fever if you are younger than age 11. There is an association with Reye's syndrome. Reye's syndrome is a rare but potentially deadly disease.  If an infection is present and antibiotics have been prescribed, take them as directed. Finish them even if you start to feel better.  Rest as needed.  Maintain an adequate fluid intake. To prevent dehydration during an illness with prolonged or recurrent fever, you may need to drink extra fluid.Drink enough fluids to keep your urine clear or pale yellow.  Sponging or bathing with room temperature water may help reduce body temperature. Do not use ice water or alcohol sponge baths.  Dress comfortably, but do not over-bundle. SEEK MEDICAL CARE IF:   You are unable to keep fluids down.  You develop vomiting or diarrhea.  You are not feeling at least partly better after 3 days.  You develop new symptoms or problems. SEEK IMMEDIATE MEDICAL CARE IF:   You have shortness of breath or trouble breathing.  You develop  excessive weakness.  You are dizzy or you faint.  You are extremely thirsty or you are making little or no urine.  You develop new pain that was not there before (such as in the head, neck, chest, back, or abdomen).  You have persistent vomiting and diarrhea for more than 1 to 2 days.  You develop a stiff neck or your eyes become sensitive to light.  You develop a skin rash.  You have a fever or persistent symptoms for more than 2 to 3 days.  You have a fever and your symptoms suddenly get worse. MAKE SURE YOU:   Understand these instructions.  Will watch your condition.  Will get help right away if you are not doing well or get worse. Document Released: 02/18/2001 Document Revised: 01/09/2014 Document Reviewed: 06/26/2011 [facility] Patient Information  2015 [place], [place]. This information is not intended to replace advice given to you by your health care provider. Make sure you discuss any questions you have with your health care provider.

## 2014-12-11 ENCOUNTER — Ambulatory Visit: Payer: Medicaid Other | Admitting: Family Medicine

## 2014-12-28 ENCOUNTER — Ambulatory Visit (INDEPENDENT_AMBULATORY_CARE_PROVIDER_SITE_OTHER): Payer: Medicaid Other | Admitting: Family Medicine

## 2014-12-28 ENCOUNTER — Encounter: Payer: Self-pay | Admitting: Family Medicine

## 2014-12-28 VITALS — BP 111/80 | HR 94 | Temp 98.2°F | Ht 65.0 in | Wt 260.0 lb

## 2014-12-28 DIAGNOSIS — Z7189 Other specified counseling: Secondary | ICD-10-CM

## 2014-12-28 DIAGNOSIS — Z7689 Persons encountering health services in other specified circumstances: Secondary | ICD-10-CM
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
## 2014-12-28 NOTE — Patient Instructions (Signed)
Nice to meet you today!  Schedule an appointment with Dr. Bonner Puna to follow up on your blood pressure and diabetes so that you can both have the same doctor.  We will get the records from Dr. Randel Pigg office.  Be well, Dr. Ardelia Mems

## 2015-01-01 NOTE — Progress Notes (Signed)
I was preceptor the day of this visit.   

## 2015-01-02 ENCOUNTER — Encounter: Payer: Self-pay | Admitting: Physical Therapy

## 2015-01-02 ENCOUNTER — Ambulatory Visit: Payer: Medicaid Other | Attending: Orthopaedic Surgery | Admitting: Physical Therapy

## 2015-01-02 DIAGNOSIS — M545 Low back pain, unspecified: Secondary | ICD-10-CM

## 2015-01-02 DIAGNOSIS — M25512 Pain in left shoulder: Secondary | ICD-10-CM | POA: Insufficient documentation
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
## 2015-01-08 ENCOUNTER — Other Ambulatory Visit: Payer: Medicaid Other

## 2015-01-15 ENCOUNTER — Other Ambulatory Visit: Payer: Self-pay | Admitting: Family Medicine

## 2015-01-15 NOTE — Telephone Encounter (Signed)
Pt called and needs refill on his Mobic, Metformin, Potassium, and Maxzide. He also was told by his Aunt that we can also prescribed Vicodin. So he would also like a refill on that too. He will be using the [facility] at [facility]. jw

## 2015-01-17 MED ORDER — POTASSIUM CHLORIDE CRYS ER 20 MEQ PO TBCR: 20.0000 meq | EXTENDED_RELEASE_TABLET | Freq: Every day | ORAL | Status: DC

## 2015-01-17 MED ORDER — MELOXICAM 7.5 MG PO TABS: 7.5000 mg | ORAL_TABLET | Freq: Every day | ORAL | Status: DC

## 2015-01-17 MED ORDER — METFORMIN HCL 500 MG PO TABS: 500.0000 mg | ORAL_TABLET | Freq: Every day | ORAL | Status: DC

## 2015-01-17 MED ORDER — TRIAMTERENE-HCTZ 37.5-25 MG PO TABS: 1.0000 | ORAL_TABLET | Freq: Every day | ORAL | Status: DC

## 2015-01-17 NOTE — Telephone Encounter (Signed)
I have reviewed pt's records from prior PCP. The history and medication list he reported to me during his office visit are consistent with his records. Most recent labs showed gluc 151, cr 0.95, K 4.1, AST 41, ALT 68. Will scan in his lab reports.  I will send in refills of the 4 medications he requested (mobic, metformin, potassium, and maxzide). As discussed with him during his initial visit here, I will NOT prescribe him hydrocodone, especially as he has only been seen here one time. He needs to schedule a follow up visit with myself or with Dr. Bonner Puna, who is his son's PCP. He appears to have a lab visit scheduled (not sure why as I didn't order any labs) but he should first come in for an appointment to discuss his chronic medical problems.   Refills sent in to his pharmacy. Please inform patient of the above bolded message.  Leeanne Rio, MD

## 2015-01-18 NOTE — Telephone Encounter (Signed)
Patient informed of message from MD, expressed understanding. 

## 2015-01-31 ENCOUNTER — Other Ambulatory Visit (INDEPENDENT_AMBULATORY_CARE_PROVIDER_SITE_OTHER): Payer: Medicaid Other

## 2015-02-07 ENCOUNTER — Other Ambulatory Visit: Payer: Medicaid Other

## 2015-02-19 ENCOUNTER — Ambulatory Visit (INDEPENDENT_AMBULATORY_CARE_PROVIDER_SITE_OTHER): Payer: Medicaid Other | Admitting: Family Medicine

## 2015-02-19 ENCOUNTER — Encounter: Payer: Self-pay | Admitting: Family Medicine

## 2015-02-19 VITALS — BP 122/72 | HR 97 | Temp 98.0°F | Wt 260.2 lb

## 2015-02-19 DIAGNOSIS — I10 Essential (primary) hypertension: Secondary | ICD-10-CM

## 2015-02-19 DIAGNOSIS — M25512 Pain in left shoulder: Secondary | ICD-10-CM | POA: Diagnosis not present

## 2015-02-19 DIAGNOSIS — G8929 Other chronic pain: Secondary | ICD-10-CM

## 2015-02-19 DIAGNOSIS — E119 Type 2 diabetes mellitus without complications: Secondary | ICD-10-CM | POA: Diagnosis not present

## 2015-02-19 DIAGNOSIS — E559 Vitamin D deficiency, unspecified: Secondary | ICD-10-CM

## 2015-02-19 DIAGNOSIS — M549 Dorsalgia, unspecified: Secondary | ICD-10-CM | POA: Diagnosis not present

## 2015-02-19 DIAGNOSIS — E669 Obesity, unspecified: Secondary | ICD-10-CM

## 2015-02-19 DIAGNOSIS — Z7189 Other specified counseling: Secondary | ICD-10-CM | POA: Diagnosis present

## 2015-02-19 LAB — LIPID PANEL
Cholesterol: 237 mg/dL — ABNORMAL HIGH (ref 0–200)
HDL: 25 mg/dL — AB (ref >=40)
TRIGLYCERIDES: 590 mg/dL — AB (ref <150)
Total CHOL/HDL Ratio: 9.5 Ratio

## 2015-02-19 LAB — CBC
HCT: 44.6 % (ref 39.0–52.0)
HEMOGLOBIN: 16.1 g/dL (ref 13.0–17.0)
MCH: 30.8 pg (ref 26.0–34.0)
MCHC: 36.1 g/dL — ABNORMAL HIGH (ref 30.0–36.0)
MCV: 85.3 fL (ref 78.0–100.0)
MPV: 9.8 fL (ref 8.6–12.4)
PLATELETS: 258 10*3/uL (ref 150–400)
RBC: 5.23 MIL/uL (ref 4.22–5.81)
RDW: 12.9 % (ref 11.5–15.5)
WBC: 6.6 10*3/uL (ref 4.0–10.5)

## 2015-02-19 LAB — BASIC METABOLIC PANEL
BUN: 8 mg/dL (ref 6–23)
CO2: 28 meq/L (ref 19–32)
Calcium: 8.7 mg/dL (ref 8.4–10.5)
Chloride: 97 mEq/L (ref 96–112)
Creat: 1.09 mg/dL (ref 0.50–1.35)
GLUCOSE: 122 mg/dL — AB (ref 70–99)
POTASSIUM: 3.5 meq/L (ref 3.5–5.3)
SODIUM: 134 meq/L — AB (ref 135–145)

## 2015-02-19 LAB — POCT GLYCOSYLATED HEMOGLOBIN (HGB A1C): Hemoglobin A1C: 6.7

## 2015-02-19 MED ORDER — GABAPENTIN 300 MG PO CAPS: 300.0000 mg | ORAL_CAPSULE | Freq: Three times a day (TID) | ORAL | Status: DC

## 2015-02-19 MED ORDER — TRIAMTERENE-HCTZ 37.5-25 MG PO TABS: 1.0000 | ORAL_TABLET | Freq: Every day | ORAL | Status: DC

## 2015-02-19 MED ORDER — METFORMIN HCL 500 MG PO TABS: 500.0000 mg | ORAL_TABLET | Freq: Every day | ORAL | Status: DC

## 2015-02-19 MED ORDER — HYDROCODONE-ACETAMINOPHEN 5-325 MG PO TABS: 1.0000 | ORAL_TABLET | Freq: Every day | ORAL | Status: DC | PRN
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
## 2015-02-19 NOTE — Progress Notes (Signed)
One of the assigned preceptors.

## 2015-02-19 NOTE — Patient Instructions (Addendum)
Take neurontin:  300mg  once a day at night x 3 days THEN take 300mg  once in the afternoon AND once at bedtime (600mg /day) x 3 days THEN take 300mg  three times per day (900mg /day) x 3 days  We will eventually stop the vicodin because you are young and need to get off of this stuff as soon as possible.   Seriously consider seeing a Psychologist, sport and exercise. We will discuss this at our next meeting in 1 month.   I will call you or send you a letter with your test results.

## 2015-02-20 LAB — VITAMIN D 25 HYDROXY (VIT D DEFICIENCY, FRACTURES): Vit D, 25-Hydroxy: 20 ng/mL — ABNORMAL LOW (ref 30–100)

## 2015-02-21 NOTE — Assessment & Plan Note (Addendum)
Hb A1c 6.7%, with goal < 6.5% given his young age, short duration of treatment and lack of serious comorbidities. Advised to increase metformin to 500mg  BID and to improve diet. Recheck in 6 months.

## 2015-02-21 NOTE — Assessment & Plan Note (Signed)
Benign exam. Recommended exercises, weight loss. Also increased gabapentin with titration schedule in AVS to 300mg  TID. I have refilled hydrocodone 5-325mg  #30 R: zero. Made sure he was aware that this is not an indication for chronic opioids nor muscle relaxers and that we will come off opioids as we increase gabapentin.

## 2015-02-21 NOTE — Assessment & Plan Note (Signed)
At goal for the past 3 visits without orthostasis. Continue maxzide.

## 2015-03-01 ENCOUNTER — Telehealth: Payer: Self-pay | Admitting: Family Medicine

## 2015-03-01 DIAGNOSIS — E559 Vitamin D deficiency, unspecified: Secondary | ICD-10-CM

## 2015-03-01 DIAGNOSIS — E785 Hyperlipidemia, unspecified: Secondary | ICD-10-CM

## 2015-03-01 DIAGNOSIS — E1169 Type 2 diabetes mellitus with other specified complication: Secondary | ICD-10-CM

## 2015-03-01 DIAGNOSIS — E781 Pure hyperglyceridemia: Secondary | ICD-10-CM | POA: Insufficient documentation

## 2015-03-01 MED ORDER — VITAMIN D (ERGOCALCIFEROL) 1.25 MG (50000 UNIT) PO CAPS: 50000.0000 [IU] | ORAL_CAPSULE | ORAL | Status: DC

## 2015-03-01 MED ORDER — ATORVASTATIN CALCIUM 40 MG PO TABS: 40.0000 mg | ORAL_TABLET | Freq: Every day | ORAL | Status: DC

## 2015-03-01 NOTE — Assessment & Plan Note (Addendum)
Triglycerides: 590mg /dl 02/19/2015. Suspect recent meal + diabetes + HCTZ contributing. Needs statin addition anyway, so will recheck fasting lipid panel after starting lipitor 40mg . If needed, can add gemfibrozil and/or switch out HCTZ.

## 2015-03-01 NOTE — Assessment & Plan Note (Signed)
Needs to be on statin. Atorvastatin 40mg  ordered.

## 2015-03-01 NOTE — Assessment & Plan Note (Signed)
25 OH-Vit D: 20; borderline deficiency/insufficiency with history of deficiency. Will give replacement dose D3 weekly x 12 weeks and recheck when rechecking DM/lipids.

## 2015-03-01 NOTE — Telephone Encounter (Signed)
Pt informed of abnormal labs and new Rx. Kemond Amorin, Salome Spotted

## 2015-03-01 NOTE — Telephone Encounter (Signed)
Problem list updated with details from recent lab work and plans.

## 2015-03-19 ENCOUNTER — Ambulatory Visit (INDEPENDENT_AMBULATORY_CARE_PROVIDER_SITE_OTHER): Payer: Medicaid Other | Admitting: Family Medicine

## 2015-03-19 ENCOUNTER — Encounter: Payer: Self-pay | Admitting: Family Medicine

## 2015-03-19 VITALS — BP 121/80 | HR 94 | Temp 97.9°F | Ht 65.0 in | Wt 263.0 lb

## 2015-03-19 DIAGNOSIS — G8929 Other chronic pain: Secondary | ICD-10-CM

## 2015-03-19 DIAGNOSIS — M549 Dorsalgia, unspecified: Secondary | ICD-10-CM | POA: Diagnosis not present

## 2015-03-19 DIAGNOSIS — E1169 Type 2 diabetes mellitus with other specified complication: Secondary | ICD-10-CM

## 2015-03-19 DIAGNOSIS — E785 Hyperlipidemia, unspecified: Secondary | ICD-10-CM

## 2015-03-19 DIAGNOSIS — Z7189 Other specified counseling: Secondary | ICD-10-CM | POA: Diagnosis not present

## 2015-03-19 DIAGNOSIS — M25569 Pain in unspecified knee: Secondary | ICD-10-CM | POA: Insufficient documentation

## 2015-03-19 DIAGNOSIS — E559 Vitamin D deficiency, unspecified: Secondary | ICD-10-CM

## 2015-03-19 DIAGNOSIS — M25512 Pain in left shoulder: Secondary | ICD-10-CM

## 2015-03-19 DIAGNOSIS — M25561 Pain in right knee: Secondary | ICD-10-CM | POA: Diagnosis not present

## 2015-03-19 MED ORDER — GABAPENTIN 400 MG PO CAPS: 400.0000 mg | ORAL_CAPSULE | Freq: Three times a day (TID) | ORAL | Status: DC
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
## 2015-03-19 NOTE — Assessment & Plan Note (Signed)
Started lipitor, will recheck LFTs when due to check Vit D level on Aug 2 or so. Pt expressed reluctance to take this due to seeing ads reporting law suits for this. No symptoms. Discussed the benefits and risks of taking this medication. He has decided to continue.

## 2015-03-19 NOTE — Assessment & Plan Note (Signed)
Could only afford 4 pills and started 7/5 so will be full 4 weeks on Aug 2. We will follow up the level as well as other labs at that time.

## 2015-03-19 NOTE — Patient Instructions (Addendum)
Go get an xray at [facility].  Take neurontin at a new dose: 400mg  three times per day. This should help you continue to use your shoulder and continue exercises.   Most shoulder pain is caused by soft tissue problems rather than arthritis.  Rotator cuff tendonitis or tendonosis, rotator cuff tears, impingement syndrome and cartilege (labrum tears) are a few of the common causes of shoulder pain.  Fortunately, most of these can be treated with conservative measures as outlined below.  Do not do the following:  Any work with the arms above shoulder level (especially lifting) until the pain has subsided.  Sleep on the affected side.  Especially avoid sleeping with your arm under your head or your pillow.  This is a habit that is hard to break.   Do the following:  Do the shoulder exercises we reviewed twice daily followed by ice for 10 minutes.  If no better in 1 month, follow up with me or with an orthopedist.  Use of over the counter pain meds can be of help.  Tylenol (or acetaminophen) is the safest to use.  It often helps to take this regularly.  You can take up to 325 mg tablets, 2 at a time, 5 times daily.

## 2015-03-19 NOTE — Assessment & Plan Note (Signed)
Increase gabapentin to 400mg  TID. No other medications ordered. Continued to recommend exercises.
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
## 2015-03-20 ENCOUNTER — Ambulatory Visit (HOSPITAL_COMMUNITY)
Admission: RE | Admit: 2015-03-20 | Discharge: 2015-03-20 | Disposition: A | Discharge status: Home / Self Care | Payer: Medicaid Other | Source: Ambulatory Visit | Attending: Family Medicine | Admitting: Family Medicine

## 2015-03-20 DIAGNOSIS — M25561 Pain in right knee: Secondary | ICD-10-CM | POA: Diagnosis not present

## 2015-03-20 DIAGNOSIS — M7989 Other specified soft tissue disorders: Secondary | ICD-10-CM | POA: Insufficient documentation

## 2015-03-20 DIAGNOSIS — G8929 Other chronic pain: Secondary | ICD-10-CM

## 2015-03-22 ENCOUNTER — Encounter: Payer: Self-pay | Admitting: Family Medicine

## 2015-04-12 ENCOUNTER — Encounter: Payer: Self-pay | Admitting: Family Medicine

## 2015-04-12 ENCOUNTER — Ambulatory Visit (INDEPENDENT_AMBULATORY_CARE_PROVIDER_SITE_OTHER): Payer: Medicaid Other | Admitting: Family Medicine

## 2015-04-12 VITALS — BP 129/82 | HR 93 | Temp 98.1°F | Ht 65.0 in | Wt 254.3 lb

## 2015-04-12 DIAGNOSIS — M25512 Pain in left shoulder: Secondary | ICD-10-CM

## 2015-04-12 DIAGNOSIS — G8929 Other chronic pain: Secondary | ICD-10-CM

## 2015-04-12 DIAGNOSIS — Z7189 Other specified counseling: Secondary | ICD-10-CM | POA: Diagnosis not present

## 2015-04-12 DIAGNOSIS — M25561 Pain in right knee: Secondary | ICD-10-CM

## 2015-04-12 DIAGNOSIS — M549 Dorsalgia, unspecified: Secondary | ICD-10-CM | POA: Diagnosis not present

## 2015-04-12 DIAGNOSIS — Z79899 Other long term (current) drug therapy: Secondary | ICD-10-CM | POA: Diagnosis not present

## 2015-04-12 DIAGNOSIS — Z5181 Encounter for therapeutic drug level monitoring: Secondary | ICD-10-CM | POA: Diagnosis not present

## 2015-04-12 DIAGNOSIS — E559 Vitamin D deficiency, unspecified: Secondary | ICD-10-CM | POA: Diagnosis not present

## 2015-04-12 LAB — HEPATIC FUNCTION PANEL
ALK PHOS: 82 U/L (ref 40–115)
ALT: 79 U/L — ABNORMAL HIGH (ref 9–46)
AST: 64 U/L — ABNORMAL HIGH (ref 10–40)
Albumin: 4 g/dL (ref 3.6–5.1)
BILIRUBIN DIRECT: 0.1 mg/dL (ref <=0.2)
BILIRUBIN INDIRECT: 0.6 mg/dL (ref 0.2–1.2)
Total Bilirubin: 0.7 mg/dL (ref 0.2–1.2)
Total Protein: 7.3 g/dL (ref 6.1–8.1)

## 2015-04-12 MED ORDER — GABAPENTIN 300 MG PO CAPS: 600.0000 mg | ORAL_CAPSULE | Freq: Three times a day (TID) | ORAL | Status: DC

## 2015-04-12 NOTE — Patient Instructions (Signed)
Keep staying active and taking tylenol everyday for inflammation-related pain. The xrays show that your pain should continue to improve with increasing activity as tolerated.  - Increase gabapentin to 600mg  three times per day (this will be 2 of the 300mg  capsules three times per day) - Let's follow up in about 2 months when we may need to increase gabapentin even more - Vicodin and other opiate medications are NOT good for you.  - Staying still is NOT good for you.  - I don't want to label you as disabled. You are going to get better. Stay active.

## 2015-04-13 ENCOUNTER — Encounter: Payer: Self-pay | Admitting: Family Medicine

## 2015-04-13 LAB — VITAMIN D 25 HYDROXY (VIT D DEFICIENCY, FRACTURES): Vit D, 25-Hydroxy: 33 ng/mL (ref 30–100)

## 2015-04-13 NOTE — Progress Notes (Signed)
Subjective: Gennie O Correa is a 39 y.o. male returning for right knee pain.  Knee pain is unchanged and shoulder pain also unchanged despite taking gabapentin as directed (400mg  TID) and starting tylenol yesterday. He has not been performing exercises. On 5 occasions, requests vicodin despite reaffirmation that this is not good medical care. Also brings disability paperwork despite being told at last visit that this is not appropriate for him. He has been taking vitamin D and lipitor as directed as well.   - Declines intervention for tobacco cessation. Active smoker.   Objective: BP 129/82 mmHg  Pulse 93  Temp(Src) 98.1 F (36.7 C) (Oral)  Ht 5\' 5"  (1.651 m)  Wt 254 lb 4.8 oz (115.35 kg)  BMI 42.32 kg/m2 Gen: Obese, well-appearing 39 y.o. male in no distress Back: Normal skin. Spine with normal alignment and no deformity. No tenderness to vertebral process palpation. Paraspinous muscles are tender and without spasm. Range of motion is full at neck and lumbar sacral regions. Straight leg raise is Neg Neuro: Sensation and motor function 5/5 bilateral lower extremities. Patellar and achilles DTR's 2+. Gait widely variable. At times normal with normal station and others markedly antalgic favoring the right leg with cane in right hand.  Right knee: No deformity/effusion/erythema/hemarthrosis/warmth. Normal ROM with +crepitus. No focal tenderness today but with global discomfort.   Negative right knee radiography reviewed in detail with patient.  Assessment/Plan: UGONNA KEIRSEY is a 39 y.o. male here for follow up of pain and other medical issues.  See problem list for plan.

## 2015-04-13 NOTE — Assessment & Plan Note (Signed)
Recheck now following supplementation.

## 2015-04-13 NOTE — Assessment & Plan Note (Signed)
Negative XR's reviewed. PO analgesics increased, continued activity as much as tolerated, no opioids, could consider injections but no significant indications for referral.

## 2015-04-13 NOTE — Assessment & Plan Note (Signed)
Unchanged with multiple reported etiologies (see overview). Increase gabapentin to 600mg  TID and continue tylenol and exercises. Can consider sports medicine referral and/or re-imaging in the future.

## 2015-04-13 NOTE — Assessment & Plan Note (Signed)
Increase gabapentin, continue tylenol. Continued to reinforce earlier advice that he is not a candidate for interventions or opioid therapy. Again asked to fill out disability paperwork which I declined after protracted explanation that I believe it is best that he stay active.

## 2015-05-16 ENCOUNTER — Telehealth: Payer: Self-pay | Admitting: Family Medicine

## 2015-05-16 DIAGNOSIS — E119 Type 2 diabetes mellitus without complications: Secondary | ICD-10-CM

## 2015-05-16 NOTE — Telephone Encounter (Signed)
Pt calling to request a refill on his metformin, potassium, and cyclobenzaprine. He needs these sent to [facility]. Thank you, Fonda Kinder, ASA

## 2015-05-18 ENCOUNTER — Other Ambulatory Visit: Payer: Self-pay | Admitting: Family Medicine

## 2015-05-18 MED ORDER — METFORMIN HCL 500 MG PO TABS: 500.0000 mg | ORAL_TABLET | Freq: Every day | ORAL | Status: DC

## 2015-05-18 NOTE — Telephone Encounter (Signed)
Refilled metformin and K, but will not refill flexeril.

## 2015-05-21 ENCOUNTER — Telehealth: Payer: Self-pay | Admitting: Family Medicine

## 2015-05-21 NOTE — Telephone Encounter (Signed)
Pt called and needs a refill on his Mobic. jw °

## 2015-05-23 MED ORDER — MELOXICAM 7.5 MG PO TABS: 7.5000 mg | ORAL_TABLET | Freq: Every day | ORAL | Status: DC

## 2015-06-13 ENCOUNTER — Encounter: Payer: Self-pay | Admitting: Family Medicine

## 2015-06-13 ENCOUNTER — Ambulatory Visit (INDEPENDENT_AMBULATORY_CARE_PROVIDER_SITE_OTHER): Payer: Medicaid Other | Admitting: Family Medicine

## 2015-06-13 VITALS — BP 120/84 | HR 86 | Temp 97.6°F | Ht 65.0 in | Wt 248.8 lb

## 2015-06-13 DIAGNOSIS — M25561 Pain in right knee: Secondary | ICD-10-CM

## 2015-06-13 DIAGNOSIS — Z7189 Other specified counseling: Secondary | ICD-10-CM | POA: Diagnosis present

## 2015-06-13 NOTE — Assessment & Plan Note (Signed)
Continued despite oral medications. XR unimpressive but with his obesity he is at risk for OA which may be mild. Administered steroid injection today. AVOID CONTROLLED SUBSTANCES AND URGED HIM TO CONTINUE TO SEEK EMPLOYMENT. Follow up 3 months.

## 2015-06-13 NOTE — Patient Instructions (Signed)
Thank you for coming in today!  - Your knee pain should continue to improve over the next several weeks and months. If you are still experiencing pain we can follow up in 3 months, otherwise I'd like to see you in about 6 months to follow up your diabetes.   Our clinic's number is [phone]. Feel free to call any time with questions or concerns. We will answer any questions after hours with our 24-hour emergency line at that number as well.   - Dr. Bonner Puna

## 2015-06-13 NOTE — Progress Notes (Signed)
Subjective: Antonio Huber is a 39 y.o. male presenting for follow up of knee pain.  He continues to try to walk, usually with a cane, but feels it is difficult to do so. Limited by right knee pain. He has lost 15 lbs in the past 3 months. Has not been dieting at all. Requests hydrocodone and a muscle relaxer today. Has been taking OTC tylenol, gabapentin and mobic with some relief. Has not had an injection in his knee but reported improvement in pain when left shoulder was injected.  - ROS: No fevers, chills, HA, vision changes, CP, SOB, N/V/D/C, abd pain, myalgias, arthralgias.  - Still smoker  Objective: BP 119/103 mmHg  Pulse 86  Temp(Src) 97.6 F (36.4 C) (Oral)  Ht 5\' 5"  (1.651 m)  Wt 248 lb 12.8 oz (112.855 kg)  BMI 41.40 kg/m2 Gen: Obese, pleasant 39 y.o. male in no distress Knee: No erythema, effusion or obvious bony abnormalities. No warmth, joint line tenderness, patellar tenderness, or condyle tenderness. ROM full in flexion and extension. Ligaments with solid consistent endpoints including ACL, PCL, LCL, MCL. Negative Mcmurray's, Apley's, and Thessaly tests. Non painful patellar compression. Patellar glide with mild crepitus. Patellar and quadriceps tendons unremarkable. Hamstring and quadriceps strength is normal.   PROCEDURE NOTE: RIGHT KNEE INJECTION: Patient was given informed consent, signed copy in the chart. Appropriate time out was taken. Area prepped and draped in usual sterile fashion. 1 cc of methylprednisolone 80 mg/ml mixed in 4 cc of 1% lidocaine without epinephrine was injected into the right knee joint capsule using an infero-medial approach. The patient tolerated the procedure well. There were no complications. Post procedure instructions were given.  Assessment/Plan: Antonio Huber is a 39 y.o. male here for right knee pain. Medial knee pain Continued despite oral medications. XR unimpressive but with his obesity he is at risk for OA which may be mild.  Administered steroid injection today. AVOID CONTROLLED SUBSTANCES AND URGED HIM TO CONTINUE TO SEEK EMPLOYMENT. Follow up 3 months.

## 2015-10-01 MED FILL — GABAPENTIN 300 MG CAPSULE: 300 | 90 days supply | Qty: 90 | Fill #1

## 2015-10-01 MED FILL — TRIAMTERENE-HCTZ 37.5-25 MG: 37.5-25 | 30 days supply | Qty: 30 | Fill #1

## 2015-10-01 MED FILL — ATORVASTATIN 40 MG TABLET: 40 | 30 days supply | Qty: 30 | Fill #0

## 2015-10-02 MED FILL — HYDROCODON-APAP 5-325: 5-325 | 30 days supply | Qty: 60 | Fill #0

## 2015-11-01 MED FILL — MELOXICAM 7.5 MG TABLET: 7.5 | 30 days supply | Qty: 30 | Fill #0

## 2015-11-01 MED FILL — TRIAMTERENE-HCTZ 37.5-25 MG: 37.5-25 | 30 days supply | Qty: 30 | Fill #2

## 2015-11-01 MED FILL — metFORMIN HCL 500 MG TABS: 500 | 90 days supply | Qty: 90 | Fill #0

## 2015-11-01 MED FILL — ATORVASTATIN 40 MG TABLET: 40 | 30 days supply | Qty: 30 | Fill #0

## 2015-11-08 MED FILL — HYDROCODON-APAP 5-325: 5-325 | 30 days supply | Qty: 60 | Fill #0

## 2015-11-08 MED FILL — GABAPENTIN 300 MG CAPSULE: 300 | 30 days supply | Qty: 90 | Fill #0

## 2015-12-13 MED FILL — MELOXICAM 7.5 MG TABLET: 7.5 | 30 days supply | Qty: 30 | Fill #1

## 2015-12-13 MED FILL — TRIAMTERENE-HCTZ 37.5-25 MG: 37.5-25 | 30 days supply | Qty: 30 | Fill #3

## 2015-12-13 MED FILL — ATORVASTATIN 40 MG TABLET: 40 | 30 days supply | Qty: 30 | Fill #1

## 2015-12-14 MED FILL — HYDROCODON-APAP 5-325: 5-325 | 30 days supply | Qty: 60 | Fill #0

## 2016-01-10 MED FILL — ATORVASTATIN 40 MG TABLET: 40 | 30 days supply | Qty: 30 | Fill #2

## 2016-01-10 MED FILL — GABAPENTIN 300 MG CAPSULE: 300 | 30 days supply | Qty: 90 | Fill #1

## 2016-01-10 MED FILL — TRIAMTERENE-HCTZ 37.5-25 MG: 37.5-25 | 30 days supply | Qty: 30 | Fill #0

## 2016-01-14 MED FILL — HYDROCODON-APAP 5-325: 5-325 | 30 days supply | Qty: 60 | Fill #0

## 2016-01-29 MED FILL — metFORMIN HCL 500 MG TABS: 500 | 30 days supply | Qty: 30 | Fill #1

## 2016-02-14 MED FILL — ATORVASTATIN 40 MG TABLET: 40 | 30 days supply | Qty: 30 | Fill #0

## 2016-02-21 MED FILL — TRIAMTERENE/HCTZ 37.5/25 TB: 37.5-25 | 30 days supply | Qty: 30 | Fill #1

## 2016-02-21 MED FILL — MELOXICAM 7.5 MG TABLET: 7.5 | 30 days supply | Qty: 30 | Fill #2

## 2016-02-21 MED FILL — GABAPENTIN 300 MG CAPSULE: 300 | 30 days supply | Qty: 90 | Fill #2

## 2016-02-22 MED FILL — HYDROCODON-APAP 5-325: 5-325 | 30 days supply | Qty: 60 | Fill #0

## 2016-02-29 MED FILL — metFORMIN HCL 500 MG TABS: 500 | 30 days supply | Qty: 30 | Fill #0

## 2016-03-28 MED FILL — ATORVASTATIN 40 MG TABLET: 40 | 30 days supply | Qty: 30 | Fill #1

## 2016-03-28 MED FILL — TRIAMTERENE/HCTZ 37.5/25 TB: 37.5-25 | 30 days supply | Qty: 30 | Fill #2

## 2016-03-28 MED FILL — HYDROCODON-APAP 5-325: 5-325 | 30 days supply | Qty: 60 | Fill #0

## 2016-04-02 MED FILL — metFORMIN HCL 500 MG TABS: 500 | 30 days supply | Qty: 30 | Fill #1

## 2016-04-08 ENCOUNTER — Emergency Department (HOSPITAL_COMMUNITY)
Admission: EM | Admit: 2016-04-08 | Discharge: 2016-04-08 | Disposition: A | Discharge status: Home / Self Care | Payer: Medicaid Other | Attending: Emergency Medicine | Admitting: Emergency Medicine

## 2016-04-08 ENCOUNTER — Emergency Department (HOSPITAL_COMMUNITY): Payer: Medicaid Other

## 2016-04-08 ENCOUNTER — Encounter (HOSPITAL_COMMUNITY): Payer: Self-pay | Admitting: *Deleted

## 2016-04-08 DIAGNOSIS — E119 Type 2 diabetes mellitus without complications: Secondary | ICD-10-CM | POA: Diagnosis not present

## 2016-04-08 DIAGNOSIS — Z79899 Other long term (current) drug therapy: Secondary | ICD-10-CM | POA: Diagnosis not present

## 2016-04-08 DIAGNOSIS — Z791 Long term (current) use of non-steroidal anti-inflammatories (NSAID): Secondary | ICD-10-CM | POA: Insufficient documentation

## 2016-04-08 DIAGNOSIS — I10 Essential (primary) hypertension: Secondary | ICD-10-CM | POA: Diagnosis not present

## 2016-04-08 DIAGNOSIS — F17210 Nicotine dependence, cigarettes, uncomplicated: Secondary | ICD-10-CM | POA: Insufficient documentation

## 2016-04-08 DIAGNOSIS — Z7984 Long term (current) use of oral hypoglycemic drugs: Secondary | ICD-10-CM | POA: Diagnosis not present

## 2016-04-08 DIAGNOSIS — M542 Cervicalgia: Secondary | ICD-10-CM | POA: Insufficient documentation

## 2016-04-08 DIAGNOSIS — Y92410 Unspecified street and highway as the place of occurrence of the external cause: Secondary | ICD-10-CM | POA: Insufficient documentation

## 2016-04-08 DIAGNOSIS — M545 Low back pain, unspecified: Secondary | ICD-10-CM

## 2016-04-08 DIAGNOSIS — Y999 Unspecified external cause status: Secondary | ICD-10-CM | POA: Diagnosis not present

## 2016-04-08 DIAGNOSIS — Y9389 Activity, other specified: Secondary | ICD-10-CM | POA: Diagnosis not present

## 2016-04-08 MED ORDER — NAPROXEN 500 MG PO TABS: 500.0000 mg | ORAL_TABLET | Freq: Two times a day (BID) | ORAL | 0 refills | Status: DC

## 2016-04-08 MED ORDER — CYCLOBENZAPRINE HCL 10 MG PO TABS: 10.0000 mg | ORAL_TABLET | Freq: Two times a day (BID) | ORAL | 0 refills | Status: DC | PRN

## 2016-04-08 MED ORDER — NAPROXEN 500 MG PO TABS
500.0000 mg | ORAL_TABLET | Freq: Once | ORAL | Status: AC
  Administered 2016-04-08: 500 mg via ORAL
  Filled 2016-04-08: qty 1
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
## 2016-04-14 MED FILL — GABAPENTIN 300 MG CAPSULE: 300 | 30 days supply | Qty: 90 | Fill #3

## 2016-04-14 MED FILL — MELOXICAM 7.5 MG TABLET: 7.5 | 30 days supply | Qty: 30 | Fill #3

## 2016-04-28 MED FILL — TRIAMTERENE/HCTZ 37.5/25 TB: 37.5-25 | 30 days supply | Qty: 30 | Fill #3

## 2016-04-28 MED FILL — ATORVASTATIN 40 MG TABLET: 40 | 30 days supply | Qty: 30 | Fill #2

## 2016-04-28 MED FILL — metFORMIN HCL 500 MG TABS: 500 | 30 days supply | Qty: 30 | Fill #2

## 2016-04-30 MED FILL — HYDROCODON-APAP 5-325: 5-325 | 30 days supply | Qty: 60 | Fill #0

## 2016-05-05 ENCOUNTER — Other Ambulatory Visit: Payer: Self-pay | Admitting: Orthopedic Surgery

## 2016-05-05 DIAGNOSIS — M25561 Pain in right knee: Secondary | ICD-10-CM

## 2016-05-05 DIAGNOSIS — M25512 Pain in left shoulder: Secondary | ICD-10-CM

## 2016-05-17 ENCOUNTER — Other Ambulatory Visit: Payer: Medicaid Other

## 2016-05-17 ENCOUNTER — Ambulatory Visit
Admission: RE | Admit: 2016-05-17 | Discharge: 2016-05-17 | Disposition: A | Discharge status: Home / Self Care | Payer: Medicaid Other | Source: Ambulatory Visit | Attending: Orthopedic Surgery | Admitting: Orthopedic Surgery

## 2016-05-17 DIAGNOSIS — M25561 Pain in right knee: Secondary | ICD-10-CM

## 2016-05-24 ENCOUNTER — Ambulatory Visit
Admission: RE | Admit: 2016-05-24 | Discharge: 2016-05-24 | Disposition: A | Discharge status: Home / Self Care | Payer: Medicaid Other | Source: Ambulatory Visit | Attending: Orthopedic Surgery | Admitting: Orthopedic Surgery

## 2016-05-24 DIAGNOSIS — M25512 Pain in left shoulder: Secondary | ICD-10-CM

## 2016-05-26 MED FILL — ATORVASTATIN 40 MG TABLET: 40 | 30 days supply | Qty: 30 | Fill #0

## 2016-06-02 MED FILL — HYDROCODON-APAP 5-325: 5-325 | 30 days supply | Qty: 60 | Fill #0

## 2016-06-05 MED FILL — MELOXICAM 7.5 MG TABLET: 7.5 | 30 days supply | Qty: 30 | Fill #0

## 2016-06-05 MED FILL — TRIAMTERENE-HCTZ 37.5-25 MG: 37.5-25 | 30 days supply | Qty: 30 | Fill #0

## 2016-06-05 MED FILL — metFORMIN HCL 500 MG TABS: 500 | 30 days supply | Qty: 30 | Fill #3

## 2016-06-13 MED FILL — GABAPENTIN 300 MG CAPSULE: 300 | 30 days supply | Qty: 90 | Fill #0

## 2016-06-30 MED FILL — ATORVASTATIN 40 MG TABLET: 40 | 30 days supply | Qty: 30 | Fill #1

## 2016-07-07 MED FILL — TRIAMTERENE-HCTZ 37.5-25 MG: 37.5-25 | 30 days supply | Qty: 30 | Fill #1

## 2016-07-07 MED FILL — metFORMIN HCL 500 MG TABS: 500 | 30 days supply | Qty: 30 | Fill #0

## 2016-07-07 MED FILL — MELOXICAM 7.5 MG TABLET: 7.5 | 30 days supply | Qty: 30 | Fill #0

## 2016-07-07 MED FILL — HYDROCODON-APAP 5-325: 5-325 | 30 days supply | Qty: 60 | Fill #0

## 2016-08-08 MED FILL — TRIAMTERENE-HCTZ 37.5-25 MG: 37.5-25 | 30 days supply | Qty: 30 | Fill #2

## 2016-08-08 MED FILL — metFORMIN HCL 500 MG TABS: 500 | 30 days supply | Qty: 30 | Fill #1

## 2016-08-08 MED FILL — GABAPENTIN 300 MG CAPSULE: 300 | 30 days supply | Qty: 90 | Fill #1

## 2016-08-08 MED FILL — HYDROCODON-APAP 5-325: 5-325 | 30 days supply | Qty: 60 | Fill #0

## 2016-08-08 MED FILL — ATORVASTATIN 40 MG TABLET: 40 | 30 days supply | Qty: 30 | Fill #2

## 2016-08-08 MED FILL — MELOXICAM 7.5 MG TABLET: 7.5 | 30 days supply | Qty: 30 | Fill #1

## 2016-09-08 MED FILL — MELOXICAM 7.5 MG TABLET: 7.5 | 30 days supply | Qty: 30 | Fill #2

## 2016-09-08 MED FILL — metFORMIN HCL 500 MG TABS: 500 | 30 days supply | Qty: 30 | Fill #2

## 2016-09-08 MED FILL — TRIAMTERENE-HCTZ 37.5-25 MG: 37.5-25 | 30 days supply | Qty: 30 | Fill #3

## 2016-09-08 MED FILL — GABAPENTIN 300 MG CAPSULE: 300 | 30 days supply | Qty: 90 | Fill #2

## 2016-09-09 ENCOUNTER — Ambulatory Visit: Payer: Medicaid Other | Attending: Orthopedic Surgery | Admitting: Physical Therapy

## 2016-09-09 ENCOUNTER — Encounter: Payer: Self-pay | Admitting: Physical Therapy

## 2016-09-09 DIAGNOSIS — M25512 Pain in left shoulder: Secondary | ICD-10-CM | POA: Diagnosis present

## 2016-09-09 DIAGNOSIS — M25612 Stiffness of left shoulder, not elsewhere classified: Secondary | ICD-10-CM | POA: Insufficient documentation

## 2016-09-09 DIAGNOSIS — G8929 Other chronic pain: Secondary | ICD-10-CM | POA: Insufficient documentation
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
## 2016-09-10 MED FILL — HYDROCODON-APAP 5-325: 5-325 | 30 days supply | Qty: 60 | Fill #0

## 2016-09-10 MED FILL — ATORVASTATIN 40 MG TABLET: 40 | 30 days supply | Qty: 30 | Fill #0

## 2016-09-30 MED FILL — metFORMIN HCL 500 MG TABS: 500 | 30 days supply | Qty: 60 | Fill #0

## 2016-10-03 MED FILL — ATORVASTATIN 40 MG TABLET: 40 | 30 days supply | Qty: 30 | Fill #0

## 2016-10-07 MED FILL — MELOXICAM 7.5 MG TABLET: 7.5 | 30 days supply | Qty: 30 | Fill #3

## 2016-10-08 MED FILL — TRIAMTERENE-HCTZ 37.5-25 MG: 37.5-25 | 30 days supply | Qty: 30 | Fill #0

## 2016-10-13 MED FILL — HYDROCODON-APAP 5-325: 5-325 | 30 days supply | Qty: 60 | Fill #0

## 2016-11-02 ENCOUNTER — Encounter (HOSPITAL_COMMUNITY): Payer: Self-pay | Admitting: Emergency Medicine

## 2016-11-02 ENCOUNTER — Emergency Department (HOSPITAL_COMMUNITY)
Admission: EM | Admit: 2016-11-02 | Discharge: 2016-11-02 | Disposition: A | Discharge status: Home / Self Care | Payer: No Typology Code available for payment source | Attending: Emergency Medicine | Admitting: Emergency Medicine

## 2016-11-02 ENCOUNTER — Emergency Department (HOSPITAL_COMMUNITY): Payer: No Typology Code available for payment source

## 2016-11-02 DIAGNOSIS — Y92410 Unspecified street and highway as the place of occurrence of the external cause: Secondary | ICD-10-CM | POA: Diagnosis not present

## 2016-11-02 DIAGNOSIS — I10 Essential (primary) hypertension: Secondary | ICD-10-CM | POA: Diagnosis not present

## 2016-11-02 DIAGNOSIS — S39012A Strain of muscle, fascia and tendon of lower back, initial encounter: Secondary | ICD-10-CM

## 2016-11-02 DIAGNOSIS — Z7984 Long term (current) use of oral hypoglycemic drugs: Secondary | ICD-10-CM | POA: Diagnosis not present

## 2016-11-02 DIAGNOSIS — Y939 Activity, unspecified: Secondary | ICD-10-CM | POA: Insufficient documentation

## 2016-11-02 DIAGNOSIS — F17210 Nicotine dependence, cigarettes, uncomplicated: Secondary | ICD-10-CM | POA: Insufficient documentation

## 2016-11-02 DIAGNOSIS — Y999 Unspecified external cause status: Secondary | ICD-10-CM | POA: Diagnosis not present

## 2016-11-02 DIAGNOSIS — E119 Type 2 diabetes mellitus without complications: Secondary | ICD-10-CM | POA: Diagnosis not present

## 2016-11-02 DIAGNOSIS — S161XXA Strain of muscle, fascia and tendon at neck level, initial encounter: Secondary | ICD-10-CM

## 2016-11-02 DIAGNOSIS — S199XXA Unspecified injury of neck, initial encounter: Secondary | ICD-10-CM | POA: Diagnosis present

## 2016-11-02 HISTORY — DX: Type 2 diabetes mellitus without complications: E11.9

## 2016-11-02 HISTORY — DX: Pure hypercholesterolemia, unspecified: E78.00

## 2016-11-02 HISTORY — DX: Essential (primary) hypertension: I10

## 2016-11-02 MED ORDER — METHOCARBAMOL 500 MG PO TABS: 500.0000 mg | ORAL_TABLET | Freq: Two times a day (BID) | ORAL | 0 refills | Status: DC
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
## 2016-11-02 NOTE — Discharge Instructions (Signed)
Your xrays are negative today. Take your home pain medications. Take robaxin as prescribed as needed for muscle spasms. Try heating pads. Stretches. Follow up with your doctor.

## 2016-11-02 NOTE — ED Notes (Signed)
Patient was alert, oriented and stable upon discharge. RN went over AVS and patient had no further questions.  Patient was advised to avoid driving while taking prescribed muscle relaxers.

## 2016-11-02 NOTE — ED Notes (Signed)
ED Provider at bedside. 
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
## 2016-11-02 NOTE — ED Notes (Signed)
Patient transported to X-ray 

## 2016-11-03 MED FILL — GABAPENTIN 300 MG CAPSULE: 300 | 30 days supply | Qty: 90 | Fill #3

## 2016-11-07 MED FILL — TRIAMTERENE-HCTZ 37.5-25 MG: 37.5-25 | 30 days supply | Qty: 30 | Fill #1

## 2016-11-07 MED FILL — MELOXICAM 7.5 MG TABLET: 7.5 | 30 days supply | Qty: 30 | Fill #0

## 2016-11-07 MED FILL — ATORVASTATIN 40 MG TABLET: 40 | 30 days supply | Qty: 30 | Fill #1

## 2016-11-07 MED FILL — metFORMIN HCL 500 MG TABS: 500 | 30 days supply | Qty: 60 | Fill #1

## 2016-11-09 ENCOUNTER — Encounter (HOSPITAL_COMMUNITY): Payer: Self-pay

## 2016-11-09 ENCOUNTER — Emergency Department (HOSPITAL_COMMUNITY)
Admission: EM | Admit: 2016-11-09 | Discharge: 2016-11-09 | Disposition: A | Discharge status: Home / Self Care | Payer: Medicare Other | Attending: Emergency Medicine | Admitting: Emergency Medicine

## 2016-11-09 ENCOUNTER — Emergency Department (HOSPITAL_COMMUNITY): Payer: Medicare Other

## 2016-11-09 DIAGNOSIS — J40 Bronchitis, not specified as acute or chronic: Secondary | ICD-10-CM

## 2016-11-09 DIAGNOSIS — Z79899 Other long term (current) drug therapy: Secondary | ICD-10-CM | POA: Diagnosis not present

## 2016-11-09 DIAGNOSIS — F17210 Nicotine dependence, cigarettes, uncomplicated: Secondary | ICD-10-CM | POA: Diagnosis not present

## 2016-11-09 DIAGNOSIS — Z7984 Long term (current) use of oral hypoglycemic drugs: Secondary | ICD-10-CM | POA: Insufficient documentation

## 2016-11-09 DIAGNOSIS — E119 Type 2 diabetes mellitus without complications: Secondary | ICD-10-CM | POA: Insufficient documentation

## 2016-11-09 DIAGNOSIS — R05 Cough: Secondary | ICD-10-CM | POA: Diagnosis present

## 2016-11-09 DIAGNOSIS — I10 Essential (primary) hypertension: Secondary | ICD-10-CM | POA: Diagnosis not present

## 2016-11-09 MED ORDER — ALBUTEROL SULFATE HFA 108 (90 BASE) MCG/ACT IN AERS
2.0000 | INHALATION_SPRAY | Freq: Once | RESPIRATORY_TRACT | Status: AC
  Administered 2016-11-09: 2 via RESPIRATORY_TRACT
  Filled 2016-11-09: qty 6.7

## 2016-11-09 MED ORDER — ALBUTEROL SULFATE (2.5 MG/3ML) 0.083% IN NEBU
5.0000 mg | INHALATION_SOLUTION | Freq: Once | RESPIRATORY_TRACT | Status: AC
  Administered 2016-11-09: 5 mg via RESPIRATORY_TRACT
  Filled 2016-11-09: qty 6

## 2016-11-09 NOTE — ED Triage Notes (Addendum)
Pt states cough/congestion and wheezing since yesterday.  Some shortness of breath. No fever.  Pt also with diarrhea starting today
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
## 2016-11-09 NOTE — ED Notes (Signed)
Discharge instructions and follow up care reviewed with patient. Patient verbalized understanding. 

## 2016-11-11 MED FILL — HYDROCODON-APAP 5-325: 5-325 | 30 days supply | Qty: 60 | Fill #0

## 2016-11-28 MED FILL — MELOXICAM 15 MG TABLET: 15 | 30 days supply | Qty: 30 | Fill #0
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
## 2016-12-12 MED FILL — ATORVASTATIN 40 MG TABLET: 40 | 30 days supply | Qty: 30 | Fill #2

## 2016-12-12 MED FILL — TRIAMTERENE-HCTZ 37.5-25 MG: 37.5-25 | 30 days supply | Qty: 30 | Fill #2

## 2016-12-12 MED FILL — metFORMIN HCL 500 MG TABS: 500 | 30 days supply | Qty: 60 | Fill #2

## 2016-12-17 MED FILL — MONTELUKAST SOD 10 MG TAB: 10 | 30 days supply | Qty: 30 | Fill #0

## 2016-12-17 MED FILL — GABAPENTIN 300 MG CAPSULE: 300 | 30 days supply | Qty: 90 | Fill #0

## 2016-12-22 MED FILL — HYDROCODON-APAP 5-325: 5-325 | 30 days supply | Qty: 60 | Fill #0

## 2017-01-02 MED FILL — OMEPRAZOLE DR 40 MG CAPSULE: 40 | 30 days supply | Qty: 30 | Fill #0

## 2017-01-19 MED FILL — MELOXICAM 7.5 MG TABLET: 7.5 | 30 days supply | Qty: 30 | Fill #1

## 2017-01-19 MED FILL — GABAPENTIN 300 MG CAPSULE: 300 | 30 days supply | Qty: 90 | Fill #1

## 2017-01-19 MED FILL — metFORMIN HCL 500 MG TABS: 500 | 30 days supply | Qty: 60 | Fill #3

## 2017-01-19 MED FILL — ATORVASTATIN 40 MG TABLET: 40 | 30 days supply | Qty: 30 | Fill #1

## 2017-01-19 MED FILL — TRIAMTERENE-HCTZ 37.5-25 MG: 37.5-25 | 30 days supply | Qty: 30 | Fill #3

## 2017-01-19 MED FILL — MONTELUKAST SOD 10 MG TAB: 10 | 30 days supply | Qty: 30 | Fill #1

## 2017-01-26 MED FILL — HYDROCODON-APAP 5-325: 5-325 | 30 days supply | Qty: 60 | Fill #0

## 2017-02-01 ENCOUNTER — Emergency Department (HOSPITAL_COMMUNITY)
Admission: EM | Admit: 2017-02-01 | Discharge: 2017-02-01 | Disposition: A | Discharge status: Home / Self Care | Payer: Medicare Other | Attending: Emergency Medicine | Admitting: Emergency Medicine

## 2017-02-01 ENCOUNTER — Encounter (HOSPITAL_COMMUNITY): Payer: Self-pay | Admitting: Emergency Medicine

## 2017-02-01 DIAGNOSIS — E119 Type 2 diabetes mellitus without complications: Secondary | ICD-10-CM | POA: Insufficient documentation

## 2017-02-01 DIAGNOSIS — K122 Cellulitis and abscess of mouth: Secondary | ICD-10-CM | POA: Insufficient documentation

## 2017-02-01 DIAGNOSIS — Z7984 Long term (current) use of oral hypoglycemic drugs: Secondary | ICD-10-CM | POA: Insufficient documentation

## 2017-02-01 DIAGNOSIS — Z79899 Other long term (current) drug therapy: Secondary | ICD-10-CM | POA: Diagnosis not present

## 2017-02-01 DIAGNOSIS — I10 Essential (primary) hypertension: Secondary | ICD-10-CM | POA: Diagnosis not present

## 2017-02-01 DIAGNOSIS — T783XXA Angioneurotic edema, initial encounter: Secondary | ICD-10-CM

## 2017-02-01 DIAGNOSIS — F17210 Nicotine dependence, cigarettes, uncomplicated: Secondary | ICD-10-CM | POA: Insufficient documentation

## 2017-02-01 DIAGNOSIS — G8929 Other chronic pain: Secondary | ICD-10-CM | POA: Insufficient documentation

## 2017-02-01 DIAGNOSIS — R1310 Dysphagia, unspecified: Secondary | ICD-10-CM | POA: Diagnosis present

## 2017-02-01 MED ORDER — FAMOTIDINE 20 MG PO TABS
20.0000 mg | ORAL_TABLET | Freq: Once | ORAL | Status: AC
  Administered 2017-02-01: 20 mg via ORAL
  Filled 2017-02-01: qty 1

## 2017-02-01 MED ORDER — DIPHENHYDRAMINE HCL 25 MG PO CAPS
50.0000 mg | ORAL_CAPSULE | Freq: Once | ORAL | Status: AC
  Administered 2017-02-01: 50 mg via ORAL
  Filled 2017-02-01: qty 2

## 2017-02-01 MED ORDER — PREDNISONE 20 MG PO TABS
60.0000 mg | ORAL_TABLET | Freq: Once | ORAL | Status: AC
  Administered 2017-02-01: 60 mg via ORAL
  Filled 2017-02-01: qty 3

## 2017-02-01 MED ORDER — PREDNISONE 20 MG PO TABS: ORAL_TABLET | ORAL | 0 refills | Status: DC

## 2017-02-01 NOTE — ED Triage Notes (Signed)
Pt from home with complaints of oral swelling. Pt states he has had the same happen with lisinopril years ago. Pt states he recently moved and had 2 new medications added to his regimen; omeprazole (about 1 months ago), and  Montelukast (about a month ago). Pt states oral swelling began this morning when he woke up. Pt has not taken any medications. Airway is clear and patent. Minimal oral swelling noted, but pt has a slightly hoarse voice.
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
## 2017-02-01 NOTE — ED Notes (Signed)
ED Provider at bedside. 

## 2017-02-01 NOTE — Discharge Instructions (Signed)
Return for any worsening symptoms.  Benadryl 50mg  every 8 hours for the next two days.

## 2017-02-03 MED FILL — predniSONE 20 MG TABS: 20 | 4 days supply | Qty: 8 | Fill #0

## 2017-02-19 MED FILL — OMEPRAZOLE DR 40 MG CAPSULE: 40 | 30 days supply | Qty: 30 | Fill #1

## 2017-02-19 MED FILL — MELOXICAM 7.5 MG TABLET: 7.5 | 30 days supply | Qty: 30 | Fill #2

## 2017-02-19 MED FILL — GABAPENTIN 300 MG CAPSULE: 300 | 30 days supply | Qty: 90 | Fill #2

## 2017-02-19 MED FILL — TRIAMTERENE-HCTZ 37.5-25 MG: 37.5-25 | 30 days supply | Qty: 30 | Fill #4

## 2017-02-19 MED FILL — metFORMIN HCL 500 MG TABS: 500 | 30 days supply | Qty: 60 | Fill #0

## 2017-02-19 MED FILL — ATORVASTATIN 40 MG TABLET: 40 | 30 days supply | Qty: 30 | Fill #2

## 2017-02-26 MED FILL — HYDROCODON-APAP 5-325: 5-325 | 30 days supply | Qty: 60 | Fill #0

## 2017-03-23 MED FILL — MELOXICAM 7.5 MG TABLET: 7.5 | 30 days supply | Qty: 30 | Fill #3

## 2017-03-23 MED FILL — ATORVASTATIN 40 MG TABLET: 40 | 30 days supply | Qty: 30 | Fill #0

## 2017-03-23 MED FILL — GABAPENTIN 300 MG CAPSULE: 300 | 30 days supply | Qty: 90 | Fill #3

## 2017-03-23 MED FILL — metFORMIN HCL 500 MG TABS: 500 | 30 days supply | Qty: 60 | Fill #1

## 2017-03-23 MED FILL — TRIAMTERENE-HCTZ 37.5-25 MG: 37.5-25 | 30 days supply | Qty: 30 | Fill #5

## 2017-03-23 MED FILL — OMEPRAZOLE DR 40 MG CAPSULE: 40 | 30 days supply | Qty: 30 | Fill #2

## 2017-03-30 MED FILL — HYDROCODON-APAP 5-325: 5-325 | 30 days supply | Qty: 60 | Fill #0

## 2017-04-22 MED FILL — ATORVASTATIN 40 MG TABLET: 40 | 30 days supply | Qty: 30 | Fill #1

## 2017-04-22 MED FILL — TRIAMTERENE-HCTZ 37.5-25 MG: 37.5-25 | 30 days supply | Qty: 30 | Fill #6

## 2017-04-22 MED FILL — metFORMIN HCL 500 MG TABS: 500 | 30 days supply | Qty: 60 | Fill #2

## 2017-04-22 MED FILL — GABAPENTIN 300 MG CAPS: 300 | 30 days supply | Qty: 90 | Fill #0

## 2017-04-22 MED FILL — OMEPRAZOLE DR 40 MG CAPSULE: 40 | 30 days supply | Qty: 30 | Fill #0

## 2017-04-22 MED FILL — MELOXICAM 7.5 MG TABLET: 7.5 | 30 days supply | Qty: 30 | Fill #0

## 2017-05-01 MED FILL — AZITHROMYCIN 250 MG TABLET: 250 | 5 days supply | Qty: 6 | Fill #0

## 2017-05-01 MED FILL — HYDROCODON-APAP 5-325: 5-325 | 30 days supply | Qty: 60 | Fill #0

## 2017-05-22 MED FILL — TRIAMTERENE/HCTZ 37.5/25 TB: 37.5-25 | 30 days supply | Qty: 30 | Fill #7

## 2017-05-22 MED FILL — ATORVASTATIN 40 MG TABLET: 40 | 30 days supply | Qty: 30 | Fill #2

## 2017-05-22 MED FILL — GABAPENTIN 300 MG CAPSULE: 300 | 30 days supply | Qty: 90 | Fill #1

## 2017-05-22 MED FILL — MELOXICAM 7.5 MG TABLET: 7.5 | 30 days supply | Qty: 30 | Fill #1

## 2017-05-22 MED FILL — OMEPRAZOLE DR 40 MG CAPSULE: 40 | 30 days supply | Qty: 30 | Fill #1

## 2017-05-22 MED FILL — metFORMIN HCL 500 MG TABS: 500 | 30 days supply | Qty: 60 | Fill #3

## 2017-05-29 MED FILL — HYDROCODON-APAP 5-325: 5-325 | 30 days supply | Qty: 60 | Fill #0

## 2017-06-25 MED FILL — ATORVASTATIN 40 MG TABLET: 40 | 30 days supply | Qty: 30 | Fill #0

## 2017-06-25 MED FILL — MELOXICAM 7.5 MG TABLET: 7.5 | 30 days supply | Qty: 30 | Fill #2

## 2017-06-25 MED FILL — metFORMIN HCL 500 MG TABS: 500 | 30 days supply | Qty: 60 | Fill #0

## 2017-06-25 MED FILL — OMEPRAZOLE DR 40 MG CAPSULE: 40 | 30 days supply | Qty: 30 | Fill #2

## 2017-06-25 MED FILL — GABAPENTIN 300 MG CAPSULE: 300 | 30 days supply | Qty: 90 | Fill #2

## 2017-06-25 MED FILL — TRIAMTERENE/HCTZ 37.5/25 TB: 37.5-25 | 30 days supply | Qty: 30 | Fill #8

## 2017-06-29 MED FILL — HYDROCODON-APAP 5-325: 5-325 | 30 days supply | Qty: 60 | Fill #0

## 2017-07-27 MED FILL — MELOXICAM 7.5 MG TABLET: 7.5 | 30 days supply | Qty: 30 | Fill #3

## 2017-07-27 MED FILL — OMEPRAZOLE DR 40 MG CAPSULE: 40 | 30 days supply | Qty: 30 | Fill #0

## 2017-07-27 MED FILL — metFORMIN HCL 500 MG TABS: 500 | 30 days supply | Qty: 60 | Fill #1

## 2017-07-27 MED FILL — GABAPENTIN 300 MG CAPSULE: 300 | 30 days supply | Qty: 90 | Fill #3

## 2017-07-27 MED FILL — ATORVASTATIN 40 MG TABLET: 40 | 30 days supply | Qty: 30 | Fill #1

## 2017-07-27 MED FILL — TRIAMTERENE/HCTZ 37.5/25 TB: 37.5-25 | 30 days supply | Qty: 30 | Fill #9

## 2017-07-29 MED FILL — HYDROCODON-APAP 5-325: 5-325 | 30 days supply | Qty: 60 | Fill #0

## 2017-08-25 MED FILL — TRIAMTERENE/HCTZ 37.5/25 TB: 37.5-25 | 30 days supply | Qty: 30 | Fill #10

## 2017-08-25 MED FILL — MELOXICAM 7.5 MG TABLET: 7.5 | 30 days supply | Qty: 30 | Fill #0

## 2017-08-25 MED FILL — metFORMIN HCL 500 MG TABS: 500 | 30 days supply | Qty: 60 | Fill #2

## 2017-08-25 MED FILL — GABAPENTIN 300 MG CAPSULE: 300 | 30 days supply | Qty: 90 | Fill #0

## 2017-08-25 MED FILL — OMEPRAZOLE DR 40 MG CAPSULE: 40 | 30 days supply | Qty: 30 | Fill #1

## 2017-08-25 MED FILL — ATORVASTATIN 40 MG TABLET: 40 | 30 days supply | Qty: 30 | Fill #2

## 2017-08-26 MED FILL — HYDROCODON-APAP 5-325: 5-325 | 30 days supply | Qty: 60 | Fill #0

## 2017-09-23 MED FILL — MELOXICAM 7.5 MG TABLET: 7.5 | 30 days supply | Qty: 30 | Fill #0

## 2017-09-24 MED FILL — metFORMIN HCL 500 MG TABS: 500 | 30 days supply | Qty: 60 | Fill #3

## 2017-09-24 MED FILL — OMEPRAZOLE DR 40 MG CAPSULE: 40 | 30 days supply | Qty: 30 | Fill #2

## 2017-09-24 MED FILL — ATORVASTATIN 40 MG TABLET: 40 | 90 days supply | Qty: 90 | Fill #0

## 2017-09-24 MED FILL — GABAPENTIN 300 MG CAPSULE: 300 | 30 days supply | Qty: 90 | Fill #1

## 2017-09-24 MED FILL — TRIAMTERENE/HCTZ 37.5/25 TB: 37.5-25 | 30 days supply | Qty: 30 | Fill #11

## 2017-09-25 MED FILL — HYDROCODON-APAP 5-325: 5-325 | 30 days supply | Qty: 60 | Fill #0

## 2017-10-26 MED FILL — OMEPRAZOLE DR 40 MG CAPSULE: 40 | 30 days supply | Qty: 30 | Fill #0

## 2017-10-26 MED FILL — GABAPENTIN 300 MG CAPSULE: 300 | 30 days supply | Qty: 90 | Fill #2

## 2017-10-26 MED FILL — MELOXICAM 7.5 MG TABLET: 7.5 | 30 days supply | Qty: 30 | Fill #1

## 2017-10-26 MED FILL — TRIAMTERENE-HCTZ 37.5-25 MG: 37.5-25 | 90 days supply | Qty: 90 | Fill #0

## 2017-10-26 MED FILL — HYDROCODON-APAP 5-325: 5-325 | 30 days supply | Qty: 60 | Fill #0

## 2017-10-26 MED FILL — metFORMIN HCL 500 MG TABS: 500 | 30 days supply | Qty: 60 | Fill #0

## 2017-11-20 MED FILL — OMEPRAZOLE DR 40 MG CAPSULE: 40 | 30 days supply | Qty: 30 | Fill #1

## 2017-11-20 MED FILL — MELOXICAM 7.5 MG TABLET: 7.5 | 30 days supply | Qty: 30 | Fill #2

## 2017-11-20 MED FILL — metFORMIN HCL 500 MG TABS: 500 | 30 days supply | Qty: 60 | Fill #1

## 2017-11-23 MED FILL — GABAPENTIN 300 MG CAPSULE: 300 | 30 days supply | Qty: 90 | Fill #3

## 2017-11-23 MED FILL — HYDROCODON-APAP 5-325: 5-325 | 30 days supply | Qty: 60 | Fill #0

## 2017-12-23 MED FILL — metFORMIN HCL 500 MG TABS: 500 | 30 days supply | Qty: 60 | Fill #2

## 2017-12-23 MED FILL — MELOXICAM 7.5 MG TABLET: 7.5 | 30 days supply | Qty: 30 | Fill #3

## 2017-12-23 MED FILL — OMEPRAZOLE DR 40 MG CAPSULE: 40 | 30 days supply | Qty: 30 | Fill #2

## 2017-12-24 MED FILL — HYDROCODON-APAP 5-325: 5-325 | 30 days supply | Qty: 60 | Fill #0

## 2017-12-28 MED FILL — GABAPENTIN 300 MG CAPSULE: 300 | 30 days supply | Qty: 90 | Fill #0

## 2017-12-28 MED FILL — ATORVASTATIN 40 MG TABLET: 40 | 90 days supply | Qty: 90 | Fill #0

## 2018-01-21 MED FILL — metFORMIN HCL 500 MG TABS: 500 | 30 days supply | Qty: 60 | Fill #0

## 2018-01-21 MED FILL — MELOXICAM 7.5 MG TABLET: 7.5 | 30 days supply | Qty: 30 | Fill #0

## 2018-01-21 MED FILL — OMEPRAZOLE DR 40 MG CAPSULE: 40 | 30 days supply | Qty: 30 | Fill #0

## 2018-01-21 MED FILL — TRIAMTERENE-HCTZ 37.5-25 MG: 37.5-25 | 90 days supply | Qty: 90 | Fill #0

## 2018-01-26 MED FILL — GABAPENTIN 300 MG CAPSULE: 300 | 30 days supply | Qty: 90 | Fill #1

## 2018-02-03 MED FILL — HYDROCODON-APAP 5-325: 5-325 | 30 days supply | Qty: 60 | Fill #0

## 2018-02-22 MED FILL — OMEPRAZOLE DR 40 MG CAPSULE: 40 | 30 days supply | Qty: 30 | Fill #1

## 2018-02-22 MED FILL — metFORMIN HCL 500 MG TABS: 500 | 30 days supply | Qty: 60 | Fill #1

## 2018-02-22 MED FILL — MELOXICAM 7.5 MG TABLET: 7.5 | 30 days supply | Qty: 30 | Fill #1

## 2018-02-25 MED FILL — GABAPENTIN 300 MG CAPSULE: 300 | 30 days supply | Qty: 90 | Fill #2

## 2018-03-03 MED FILL — HYDROCODON-APAP 5-325: 5-325 | 30 days supply | Qty: 60 | Fill #0

## 2018-03-23 MED FILL — ATORVASTATIN 40 MG TABLET: 40 | 90 days supply | Qty: 90 | Fill #1

## 2018-03-23 MED FILL — OMEPRAZOLE DR 40 MG CAPSULE: 40 | 30 days supply | Qty: 30 | Fill #2

## 2018-03-23 MED FILL — metFORMIN HCL 500 MG TABS: 500 | 30 days supply | Qty: 60 | Fill #2

## 2018-03-23 MED FILL — MELOXICAM 7.5 MG TABLET: 7.5 | 30 days supply | Qty: 30 | Fill #2

## 2018-03-26 MED FILL — GABAPENTIN 300 MG CAPSULE: 300 | 30 days supply | Qty: 90 | Fill #3

## 2018-04-22 MED FILL — TRIAMTERENE/HCTZ 37.5/25 TB: 37.5-25 | 90 days supply | Qty: 90 | Fill #1

## 2018-04-22 MED FILL — GABAPENTIN 300 MG CAPSULE: 300 | 30 days supply | Qty: 90 | Fill #4

## 2018-04-22 MED FILL — MELOXICAM 7.5 MG TABLET: 7.5 | 30 days supply | Qty: 30 | Fill #3

## 2018-04-22 MED FILL — metFORMIN HCL 500 MG TABS: 500 | 30 days supply | Qty: 60 | Fill #3

## 2018-04-26 MED FILL — OMEPRAZOLE 40 MG CPDR: 40 | 30 days supply | Qty: 30 | Fill #0

## 2018-05-11 MED FILL — HYDROCODON-APAP 5-325: 5-325 | 30 days supply | Qty: 60 | Fill #0

## 2018-05-12 ENCOUNTER — Ambulatory Visit: Payer: Medicare Other | Attending: Anesthesiology | Admitting: Physical Therapy

## 2018-05-12 ENCOUNTER — Other Ambulatory Visit: Payer: Self-pay

## 2018-05-12 DIAGNOSIS — M542 Cervicalgia: Secondary | ICD-10-CM | POA: Diagnosis present

## 2018-05-12 DIAGNOSIS — G8929 Other chronic pain: Secondary | ICD-10-CM | POA: Diagnosis present

## 2018-05-12 DIAGNOSIS — M545 Low back pain: Secondary | ICD-10-CM | POA: Diagnosis present

## 2018-05-12 DIAGNOSIS — M25612 Stiffness of left shoulder, not elsewhere classified: Secondary | ICD-10-CM | POA: Diagnosis present

## 2018-05-12 DIAGNOSIS — M25512 Pain in left shoulder: Secondary | ICD-10-CM | POA: Insufficient documentation
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
## 2018-05-20 ENCOUNTER — Encounter: Payer: Self-pay | Admitting: Physical Therapy

## 2018-05-20 ENCOUNTER — Ambulatory Visit: Payer: Medicare Other | Admitting: Physical Therapy

## 2018-05-20 DIAGNOSIS — M542 Cervicalgia: Secondary | ICD-10-CM

## 2018-05-20 DIAGNOSIS — M25512 Pain in left shoulder: Secondary | ICD-10-CM

## 2018-05-20 DIAGNOSIS — M545 Low back pain, unspecified: Secondary | ICD-10-CM

## 2018-05-20 DIAGNOSIS — G8929 Other chronic pain: Secondary | ICD-10-CM

## 2018-05-20 DIAGNOSIS — M25612 Stiffness of left shoulder, not elsewhere classified: Secondary | ICD-10-CM
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
## 2018-05-24 ENCOUNTER — Encounter: Payer: Medicare Other | Admitting: Physical Therapy

## 2018-05-24 MED FILL — GABAPENTIN 300 MG CAPSULE: 300 | 30 days supply | Qty: 90 | Fill #5

## 2018-05-24 MED FILL — OMEPRAZOLE 40 MG CPDR: 40 | 30 days supply | Qty: 30 | Fill #1

## 2018-05-24 MED FILL — MELOXICAM 7.5 MG TABLET: 7.5 | 30 days supply | Qty: 30 | Fill #0

## 2018-05-24 MED FILL — metFORMIN HCL 500 MG TABS: 500 | 30 days supply | Qty: 60 | Fill #0

## 2018-05-26 ENCOUNTER — Encounter: Payer: Medicare Other | Admitting: Physical Therapy

## 2018-06-10 MED FILL — HYDROCODON-APAP 5-325: 5-325 | 30 days supply | Qty: 60 | Fill #0

## 2018-06-23 MED FILL — GABAPENTIN 300 MG CAPSULE: 300 | 30 days supply | Qty: 90 | Fill #6

## 2018-06-23 MED FILL — metFORMIN HCL 500 MG TABS: 500 | 30 days supply | Qty: 60 | Fill #1

## 2018-06-23 MED FILL — OMEPRAZOLE 40 MG CPDR: 40 | 30 days supply | Qty: 30 | Fill #2

## 2018-06-23 MED FILL — ATORVASTATIN 40 MG TABLET: 40 | 90 days supply | Qty: 90 | Fill #2

## 2018-06-23 MED FILL — MELOXICAM 7.5 MG TABLET: 7.5 | 30 days supply | Qty: 30 | Fill #1

## 2018-06-29 ENCOUNTER — Other Ambulatory Visit: Payer: Self-pay | Admitting: Orthopedic Surgery

## 2018-06-29 DIAGNOSIS — M25512 Pain in left shoulder: Secondary | ICD-10-CM

## 2018-07-04 ENCOUNTER — Other Ambulatory Visit: Payer: Medicare Other

## 2018-07-08 ENCOUNTER — Ambulatory Visit
Admission: RE | Admit: 2018-07-08 | Discharge: 2018-07-08 | Disposition: A | Discharge status: Home / Self Care | Payer: Medicare Other | Source: Ambulatory Visit | Attending: Orthopedic Surgery | Admitting: Orthopedic Surgery

## 2018-07-08 DIAGNOSIS — M25512 Pain in left shoulder: Secondary | ICD-10-CM

## 2018-07-12 ENCOUNTER — Other Ambulatory Visit: Payer: Self-pay | Admitting: Orthopedic Surgery

## 2018-07-12 DIAGNOSIS — M542 Cervicalgia: Secondary | ICD-10-CM

## 2018-07-12 MED FILL — HYDROCODON-APAP 5-325: 5-325 | 30 days supply | Qty: 60 | Fill #0

## 2018-07-15 ENCOUNTER — Ambulatory Visit
Admission: RE | Admit: 2018-07-15 | Discharge: 2018-07-15 | Disposition: A | Discharge status: Home / Self Care | Payer: Medicare Other | Source: Ambulatory Visit | Attending: Orthopedic Surgery | Admitting: Orthopedic Surgery

## 2018-07-15 DIAGNOSIS — M542 Cervicalgia: Secondary | ICD-10-CM

## 2018-07-22 MED FILL — metFORMIN HCL 500 MG TABS: 500 | 30 days supply | Qty: 60 | Fill #2

## 2018-07-22 MED FILL — MELOXICAM 7.5 MG TABLET: 7.5 | 30 days supply | Qty: 30 | Fill #2

## 2018-07-23 MED FILL — TRIAMTERENE/HCTZ 37.5/25 TB: 37.5-25 | 90 days supply | Qty: 90 | Fill #2

## 2018-07-23 MED FILL — diazePAM 5 MG TABS: 5 | 1 days supply | Qty: 2 | Fill #0

## 2018-07-26 MED FILL — OMEPRAZOLE 40 MG CPDR: 40 | 30 days supply | Qty: 30 | Fill #0

## 2018-07-27 MED FILL — GABAPENTIN 300 MG CAPSULE: 300 | 30 days supply | Qty: 90 | Fill #0

## 2018-08-10 MED FILL — HYDROCODON-APAP 5-325: 5-325 | 30 days supply | Qty: 60 | Fill #0

## 2018-08-23 MED FILL — metFORMIN HCL 500 MG TABS: 500 | 30 days supply | Qty: 60 | Fill #3

## 2018-08-23 MED FILL — OMEPRAZOLE 40 MG CPDR: 40 | 30 days supply | Qty: 30 | Fill #1

## 2018-08-24 ENCOUNTER — Other Ambulatory Visit: Payer: Self-pay

## 2018-08-24 ENCOUNTER — Emergency Department (HOSPITAL_COMMUNITY): Payer: Medicare Other

## 2018-08-24 ENCOUNTER — Emergency Department (HOSPITAL_COMMUNITY)
Admission: EM | Admit: 2018-08-24 | Discharge: 2018-08-24 | Disposition: A | Discharge status: Home / Self Care | Payer: Medicare Other | Attending: Emergency Medicine | Admitting: Emergency Medicine

## 2018-08-24 ENCOUNTER — Encounter (HOSPITAL_COMMUNITY): Payer: Self-pay | Admitting: Emergency Medicine

## 2018-08-24 DIAGNOSIS — Z7984 Long term (current) use of oral hypoglycemic drugs: Secondary | ICD-10-CM | POA: Insufficient documentation

## 2018-08-24 DIAGNOSIS — Z79899 Other long term (current) drug therapy: Secondary | ICD-10-CM | POA: Insufficient documentation

## 2018-08-24 DIAGNOSIS — R05 Cough: Secondary | ICD-10-CM | POA: Diagnosis present

## 2018-08-24 DIAGNOSIS — E119 Type 2 diabetes mellitus without complications: Secondary | ICD-10-CM | POA: Insufficient documentation

## 2018-08-24 DIAGNOSIS — R070 Pain in throat: Secondary | ICD-10-CM | POA: Diagnosis not present

## 2018-08-24 DIAGNOSIS — F17210 Nicotine dependence, cigarettes, uncomplicated: Secondary | ICD-10-CM | POA: Diagnosis not present

## 2018-08-24 DIAGNOSIS — I10 Essential (primary) hypertension: Secondary | ICD-10-CM | POA: Diagnosis not present

## 2018-08-24 DIAGNOSIS — J069 Acute upper respiratory infection, unspecified: Secondary | ICD-10-CM | POA: Diagnosis not present

## 2018-08-24 DIAGNOSIS — Z720 Tobacco use: Secondary | ICD-10-CM

## 2018-08-24 NOTE — ED Notes (Signed)
Bed: WTR6 Expected date:  Expected time:  Means of arrival:  Comments: 

## 2018-08-24 NOTE — ED Triage Notes (Signed)
Pt c/o sore throat and cough that is productive since last Thursday. Child and girlfriend have been sick as well.

## 2018-08-24 NOTE — ED Provider Notes (Signed)
[facility] DEPT Provider Note   CSN: [id number] Arrival date & time: 08/24/18  4854     History   Chief Complaint Chief Complaint  Patient presents with  . Cough  . Sore Throat    HPI   Antonio Huber is a 41 y.o. male who complains of congestion, sore throat and productive cough for 5 days. He denies a history of anorexia, chest pain, fevers, shortness of breath, vomiting and wheezing and denies a history of asthma. Patient admits to smoke cigarettes. He  has a past medical history of Diabetes mellitus without complication ([place]), High cholesterol, Hypertension, and Musculoskeletal pain. Patient is on multiple medications and wants to know what he can take with his current med list and active medical problems    HPI  Past Medical History:  Diagnosis Date  . Diabetes mellitus without complication (HCC)    Type 2  . High cholesterol   . Hypertension   . Musculoskeletal pain    after multiple traumatic events    Patient Active Problem List   Diagnosis Date Noted  . Medial knee pain 03/19/2015  . Hypertriglyceridemia 03/01/2015  . Dyslipidemia associated with type 2 diabetes mellitus ([place]) 03/01/2015  . Vitamin D deficiency 02/19/2015  . T2DM (type 2 diabetes mellitus) ([place]) 02/19/2015  . Shoulder pain 06/11/2012  . Chronic back pain 06/07/2012  . Frequent urination 06/07/2012  . Obesity 06/07/2012  . HTN (hypertension) 06/07/2012    History reviewed. No pertinent surgical history.      Home Medications    Prior to Admission medications   Medication Sig Start Date End Date Taking? Authorizing Provider  atorvastatin (LIPITOR) 40 MG tablet Take 1 tablet (40 mg total) by mouth daily. 03/01/15   Patrecia Pour, MD  cholecalciferol (VITAMIN D) 1000 units tablet Take 1,000 Units by mouth daily.    [provider]  gabapentin (NEURONTIN) 300 MG capsule Take 2 capsules (600 mg total) by mouth 3 (three) times daily. Patient  taking differently: Take 300 mg by mouth 3 (three) times daily.  04/12/15   Patrecia Pour, MD  HYDROcodone-acetaminophen (NORCO/VICODIN) 5-325 MG per tablet Take 1 tablet by mouth daily as needed for moderate pain. 02/19/15   Patrecia Pour, MD  meloxicam (MOBIC) 7.5 MG tablet Take 1 tablet (7.5 mg total) by mouth daily. 05/23/15   Patrecia Pour, MD  metFORMIN (GLUCOPHAGE) 500 MG tablet Take 1 tablet (500 mg total) by mouth daily with breakfast. 05/18/15   Patrecia Pour, MD  methocarbamol (ROBAXIN) 500 MG tablet Take 1 tablet (500 mg total) by mouth 2 (two) times daily. Patient not taking: Reported on 02/01/2017 11/02/16   Jeannett Senior, PA-C  montelukast (SINGULAIR) 10 MG tablet Take 10 mg by mouth daily. 12/17/16   [provider]  Multiple Vitamin (MULTIVITAMIN WITH MINERALS) TABS tablet Take 1 tablet by mouth daily.    [provider]  omeprazole (PRILOSEC) 40 MG capsule Take 40 mg by mouth daily.    [provider]  potassium chloride SA (K-DUR,KLOR-CON) 20 MEQ tablet TAKE 1 TABLET BY MOUTH ONCE DAILY Patient not taking: Reported on 02/01/2017 05/18/15   Patrecia Pour, MD  predniSONE (DELTASONE) 20 MG tablet 2 tabs po daily x 4 days 02/01/17   Deno Etienne, DO  triamterene-hydrochlorothiazide (MAXZIDE-25) 37.5-25 MG per tablet Take 1 tablet by mouth daily. 02/19/15   Patrecia Pour, MD  Vitamin D, Ergocalciferol, (DRISDOL) 50000 UNITS CAPS capsule Take 1 capsule (50,000  Units total) by mouth every 7 (seven) days. Patient not taking: Reported on 02/01/2017 03/01/15   Patrecia Pour, MD    Family History Family History  Problem Relation Age of Onset  . Cancer Maternal Grandmother        breast  . Carpal tunnel syndrome Sister     Social History Social History   Tobacco Use  . Smoking status: Current Every Day Smoker    Packs/day: 0.25    Types: Cigarettes  . Smokeless tobacco: Never Used  Substance Use Topics  . Alcohol use: No    Alcohol/week: 0.0 standard drinks    . Drug use: No     Allergies   Lisinopril   Review of Systems Review of Systems  Ten systems reviewed and are negative for acute change, except as noted in the HPI.   Physical Exam Updated Vital Signs BP 119/75 (BP Location: Right Arm)   Pulse 96   Temp 98.6 F (37 C) (Oral)   Resp 18   SpO2 96%   Physical Exam Vitals signs and nursing note reviewed.  Constitutional:      Appearance: He is well-developed. He is obese. He is not ill-appearing.  HENT:     Head: Normocephalic and atraumatic.     Right Ear: Tympanic membrane normal.     Left Ear: Tympanic membrane normal.     Nose: Congestion present.     Mouth/Throat:     Mouth: Mucous membranes are moist.     Pharynx: Uvula midline. Posterior oropharyngeal erythema present. No oropharyngeal exudate.     Tonsils: No tonsillar exudate.  Eyes:     Conjunctiva/sclera: Conjunctivae normal.     Pupils: Pupils are equal, round, and reactive to light.  Neck:     Musculoskeletal: Normal range of motion.  Cardiovascular:     Rate and Rhythm: Normal rate and regular rhythm.  Pulmonary:     Effort: Pulmonary effort is normal.     Breath sounds: Rhonchi present.  Abdominal:     General: There is no distension.     Palpations: Abdomen is soft.     Tenderness: There is no abdominal tenderness.  Skin:    General: Skin is warm and dry.     Capillary Refill: Capillary refill takes less than 2 seconds.  Neurological:     General: No focal deficit present.     Mental Status: He is alert and oriented to person, place, and time.      ED Treatments / Results  Labs (all labs ordered are listed, but only abnormal results are displayed) Labs Reviewed - No data to display  EKG None  Radiology No results found.  Procedures Procedures (including critical care time)  Medications Ordered in ED Medications - No data to display   Initial Impression / Assessment and Plan / ED Course  I have reviewed the triage vital signs  and the nursing notes.  Pertinent labs & imaging results that were available during my care of the patient were reviewed by me and considered in my medical decision making (see chart for details).     You appear to have an upper respiratory infection (URI). An upper respiratory tract infection, or cold, is a viral infection of the air passages leading to the lungs. It is contagious and can be spread to others, especially during the first 3 or 4 days. It cannot be cured by antibiotics or other medicines. RETURN IMMEDIATELY IF you develop shortness of breath, confusion or altered  mental status, a new rash, become dizzy, faint, or poorly responsive, or are unable to be cared for at home.   The patient was counseled on the dangers of tobacco use, and was advised to quit. Reviewed strategies to maximize success, including removing cigarettes and smoking materials from environment, stress management, substitution of other forms of reinforcement, support of family/friends and written materials.   Final Clinical Impressions(s) / ED Diagnoses   Final diagnoses:  Upper respiratory tract infection, unspecified type  Tobacco abuse    ED Discharge Orders    None       Margarita Mail, PA-C 08/24/18 1740    Duffy Bruce, MD 08/25/18 [id number]

## 2018-08-24 NOTE — Discharge Instructions (Addendum)
Your Chest xray was negative. Take the Corcidin HBP medicine for symptom relief REMEMBER- you may only take up to 3000 mg of tylenol daily so be aware of medications that contain tylenol and how much you are taking daily.  You appear to have an upper respiratory infection (URI). An upper respiratory tract infection, or cold, is a viral infection of the air passages leading to the lungs. It is contagious and can be spread to others, especially during the first 3 or 4 days. It cannot be cured by antibiotics or other medicines. RETURN IMMEDIATELY IF you develop shortness of breath, confusion or altered mental status, a new rash, become dizzy, faint, or poorly responsive, or are unable to be cared for at home.

## 2018-09-06 MED FILL — GABAPENTIN 300 MG CAPSULE: 300 | 30 days supply | Qty: 90 | Fill #0

## 2018-09-06 MED FILL — MELOXICAM 7.5 MG TABLET: 7.5 | 30 days supply | Qty: 30 | Fill #3

## 2018-09-09 MED FILL — ATORVASTATIN 40 MG TABLET: 40 | 90 days supply | Qty: 90 | Fill #0

## 2018-09-09 MED FILL — HYDROCODON-APAP 5-325: 5-325 | 30 days supply | Qty: 90 | Fill #0

## 2018-09-15 MED FILL — metFORMIN HCL 500 MG TABS: 500 | 30 days supply | Qty: 60 | Fill #4

## 2018-09-20 MED FILL — OMEPRAZOLE 40 MG CPDR: 40 | 30 days supply | Qty: 30 | Fill #2

## 2018-10-04 MED FILL — GABAPENTIN 300 MG CAPSULE: 300 | 30 days supply | Qty: 90 | Fill #1

## 2018-10-04 MED FILL — MELOXICAM 7.5 MG TABLET: 7.5 | 90 days supply | Qty: 90 | Fill #0

## 2018-10-05 MED FILL — metFORMIN HCL 1000 MG TABS: 1000 | 30 days supply | Qty: 60 | Fill #0

## 2018-10-08 MED FILL — HYDROCODON-APAP 5-325: 5-325 | 30 days supply | Qty: 90 | Fill #0

## 2018-10-18 MED FILL — OMEPRAZOLE 40 MG CPDR: 40 | 90 days supply | Qty: 90 | Fill #0

## 2018-10-18 MED FILL — TRIAMTERENE/HCTZ 37.5/25 TB: 37.5-25 | 90 days supply | Qty: 90 | Fill #3

## 2018-11-02 MED FILL — metFORMIN HCL 1000 MG TABS: 1000 | 30 days supply | Qty: 60 | Fill #1 | Status: TO

## 2018-11-02 MED FILL — GABAPENTIN 300 MG CAPSULE: 300 | 30 days supply | Qty: 90 | Fill #2 | Status: TO

## 2018-11-06 MED FILL — HYDROCODON-APAP 5-325: 5-325 | 30 days supply | Qty: 90 | Fill #0

## 2018-12-01 MED FILL — HYDROCODON-APAP 5-325: 5-325 | 30 days supply | Qty: 90 | Fill #0

## 2018-12-01 MED FILL — GABAPENTIN 300 MG CAPSULE: 300 | 30 days supply | Qty: 90 | Fill #0

## 2018-12-01 MED FILL — ATORVASTATIN 40 MG TABLET: 40 | 90 days supply | Qty: 90 | Fill #0

## 2018-12-01 MED FILL — metFORMIN HCL 1000 MG TABS: 1000 | 30 days supply | Qty: 60 | Fill #0

## 2018-12-20 MED FILL — MELOXICAM 7.5 MG TABLET: 7.5 | 90 days supply | Qty: 90 | Fill #0

## 2018-12-30 MED FILL — HYDROCODON-APAP 5-325: 5-325 | 30 days supply | Qty: 90 | Fill #0

## 2018-12-30 MED FILL — GABAPENTIN 300 MG CAPSULE: 300 | 30 days supply | Qty: 90 | Fill #0

## 2018-12-30 MED FILL — metFORMIN HCL 1000 MG TABS: 1000 | 90 days supply | Qty: 180 | Fill #0

## 2018-12-30 MED FILL — NARCAN 4 MG NASAL SPRAY: 4 | 4 days supply | Qty: 4 | Fill #0

## 2019-01-14 MED FILL — OMEPRAZOLE 40 MG CPDR: 40 | 90 days supply | Qty: 90 | Fill #0

## 2019-01-14 MED FILL — TRIAMTERENE/HCTZ 37.5/25 TB: 37.5-25 | 90 days supply | Qty: 90 | Fill #0

## 2019-01-27 MED FILL — HYDROCODON-APAP 5-325: 5-325 | 30 days supply | Qty: 90 | Fill #0

## 2019-01-27 MED FILL — GABAPENTIN 300 MG CAPSULE: 300 | 30 days supply | Qty: 90 | Fill #1

## 2019-02-24 MED FILL — MELOXICAM 15 MG TABLET: 15 | 30 days supply | Qty: 30 | Fill #0

## 2019-02-24 MED FILL — HYDROCODON-APAP 5-325: 5-325 | 30 days supply | Qty: 90 | Fill #0

## 2019-02-24 MED FILL — GABAPENTIN 300 MG CAPSULE: 300 | 30 days supply | Qty: 90 | Fill #0

## 2019-03-18 MED FILL — ATORVASTATIN 40 MG TABLET: 40 | 90 days supply | Qty: 90 | Fill #0

## 2019-03-18 MED FILL — GABAPENTIN 300 MG CAPSULE: 300 | 30 days supply | Qty: 90 | Fill #0

## 2019-03-23 MED FILL — HYDROCODON-APAP 5-325: 5-325 | 30 days supply | Qty: 90 | Fill #0

## 2019-03-23 MED FILL — MELOXICAM 15 MG TABLET: 15 | 30 days supply | Qty: 30 | Fill #0

## 2019-04-06 MED FILL — metFORMIN HCL 1000 MG TABS: 1000 | 90 days supply | Qty: 180 | Fill #1

## 2019-04-19 MED FILL — TRIAMTERENE/HCTZ 37.5/25 TB: 37.5-25 | 90 days supply | Qty: 90 | Fill #0

## 2019-04-19 MED FILL — GABAPENTIN 300 MG CAPSULE: 300 | 30 days supply | Qty: 90 | Fill #1

## 2019-04-19 MED FILL — OMEPRAZOLE 40 MG CPDR: 40 | 90 days supply | Qty: 90 | Fill #0

## 2019-04-21 MED FILL — HYDROCODON-APAP 5-325: 5-325 | 30 days supply | Qty: 90 | Fill #0

## 2019-04-21 MED FILL — MELOXICAM 15 MG TABLET: 15 | 30 days supply | Qty: 30 | Fill #0

## 2019-05-19 MED FILL — GABAPENTIN 300 MG CAPSULE: 300 | 30 days supply | Qty: 90 | Fill #2

## 2019-05-20 MED FILL — MELOXICAM 15 MG TABLET: 15 | 30 days supply | Qty: 30 | Fill #0

## 2019-05-20 MED FILL — HYDROCODON-APAP 5-325: 5-325 | 30 days supply | Qty: 90 | Fill #0

## 2019-06-15 MED FILL — GABAPENTIN 300 MG CAPSULE: 300 | 30 days supply | Qty: 90 | Fill #3

## 2019-06-16 MED FILL — ATORVASTATIN 40 MG TABLET: 40 | 90 days supply | Qty: 90 | Fill #1

## 2019-06-18 MED FILL — HYDROCODON-APAP 5-325: 5-325 | 30 days supply | Qty: 90 | Fill #0

## 2019-06-20 MED FILL — MELOXICAM 7.5 MG TABLET: 7.5 | 90 days supply | Qty: 90 | Fill #0

## 2019-06-20 MED FILL — TERBINAFINE HCL 250 MG TAB: 250 | 90 days supply | Qty: 90 | Fill #0

## 2019-07-08 MED FILL — metFORMIN HCL 1000 MG TABS: 1000 | 90 days supply | Qty: 180 | Fill #0

## 2019-07-09 ENCOUNTER — Emergency Department (HOSPITAL_COMMUNITY)
Admission: EM | Admit: 2019-07-09 | Discharge: 2019-07-10 | Disposition: A | Discharge status: Home / Self Care | Payer: Medicare Other | Attending: Emergency Medicine | Admitting: Emergency Medicine

## 2019-07-09 ENCOUNTER — Encounter (HOSPITAL_COMMUNITY): Payer: Self-pay | Admitting: Emergency Medicine

## 2019-07-09 ENCOUNTER — Other Ambulatory Visit: Payer: Self-pay

## 2019-07-09 DIAGNOSIS — Z79899 Other long term (current) drug therapy: Secondary | ICD-10-CM | POA: Insufficient documentation

## 2019-07-09 DIAGNOSIS — F17210 Nicotine dependence, cigarettes, uncomplicated: Secondary | ICD-10-CM | POA: Diagnosis not present

## 2019-07-09 DIAGNOSIS — K0889 Other specified disorders of teeth and supporting structures: Secondary | ICD-10-CM | POA: Insufficient documentation

## 2019-07-09 DIAGNOSIS — I10 Essential (primary) hypertension: Secondary | ICD-10-CM | POA: Insufficient documentation

## 2019-07-09 DIAGNOSIS — Z7984 Long term (current) use of oral hypoglycemic drugs: Secondary | ICD-10-CM | POA: Insufficient documentation

## 2019-07-09 DIAGNOSIS — E119 Type 2 diabetes mellitus without complications: Secondary | ICD-10-CM | POA: Diagnosis not present

## 2019-07-10 DIAGNOSIS — K0889 Other specified disorders of teeth and supporting structures: Secondary | ICD-10-CM | POA: Diagnosis not present

## 2019-07-10 MED ORDER — IBUPROFEN 800 MG PO TABS
800.0000 mg | ORAL_TABLET | Freq: Once | ORAL | Status: AC
  Administered 2019-07-10: 800 mg via ORAL
  Filled 2019-07-10: qty 1

## 2019-07-10 MED ORDER — AMOXICILLIN 500 MG PO CAPS
1000.0000 mg | ORAL_CAPSULE | Freq: Once | ORAL | Status: AC
  Administered 2019-07-10: 1000 mg via ORAL
  Filled 2019-07-10: qty 2

## 2019-07-10 MED ORDER — ACETAMINOPHEN 500 MG PO TABS
1000.0000 mg | ORAL_TABLET | Freq: Once | ORAL | Status: AC
  Administered 2019-07-10: 01:00:00 1000 mg via ORAL
  Filled 2019-07-10 (×2): qty 2

## 2019-07-10 MED ORDER — AMOXICILLIN 500 MG PO CAPS: 500.0000 mg | ORAL_CAPSULE | Freq: Three times a day (TID) | ORAL | 0 refills | Status: DC

## 2019-07-10 MED ORDER — IBUPROFEN 800 MG PO TABS: 800.0000 mg | ORAL_TABLET | Freq: Four times a day (QID) | ORAL | 0 refills | Status: DC | PRN

## 2019-07-10 NOTE — ED Provider Notes (Signed)
[facility] DEPT Provider Note   CSN: [id number] Arrival date & time: 07/09/19  2305     History   Chief Complaint Chief Complaint  Patient presents with  . Dental Pain    HPI Antonio Huber is a 43 y.o. male.     Patient presents to the emergency department for evaluation of toothache.  Patient reports that he recently had a tooth on the left lower portion of his jaw crack and for the last 24 hours he has been having severe pain with some mild swelling of the face.  He has not had a fever.     Past Medical History:  Diagnosis Date  . Diabetes mellitus without complication (HCC)    Type 2  . High cholesterol   . Hypertension   . Musculoskeletal pain    after multiple traumatic events    Patient Active Problem List   Diagnosis Date Noted  . Medial knee pain 03/19/2015  . Hypertriglyceridemia 03/01/2015  . Dyslipidemia associated with type 2 diabetes mellitus ([place]) 03/01/2015  . Vitamin D deficiency 02/19/2015  . T2DM (type 2 diabetes mellitus) ([place]) 02/19/2015  . Shoulder pain 06/11/2012  . Chronic back pain 06/07/2012  . Frequent urination 06/07/2012  . Obesity 06/07/2012  . HTN (hypertension) 06/07/2012    History reviewed. No pertinent surgical history.      Home Medications    Prior to Admission medications   Medication Sig Start Date End Date Taking? Authorizing Provider  amoxicillin (AMOXIL) 500 MG capsule Take 1 capsule (500 mg total) by mouth 3 (three) times daily. 07/10/19   Orpah Greek, MD  atorvastatin (LIPITOR) 40 MG tablet Take 1 tablet (40 mg total) by mouth daily. 03/01/15   Patrecia Pour, MD  cholecalciferol (VITAMIN D) 1000 units tablet Take 1,000 Units by mouth daily.    [provider]  gabapentin (NEURONTIN) 300 MG capsule Take 2 capsules (600 mg total) by mouth 3 (three) times daily. Patient taking differently: Take 300 mg by mouth 3 (three) times daily.  04/12/15   Patrecia Pour, MD   HYDROcodone-acetaminophen (NORCO/VICODIN) 5-325 MG per tablet Take 1 tablet by mouth daily as needed for moderate pain. 02/19/15   Patrecia Pour, MD  ibuprofen (ADVIL) 800 MG tablet Take 1 tablet (800 mg total) by mouth every 6 (six) hours as needed for moderate pain. 07/10/19   Orpah Greek, MD  meloxicam (MOBIC) 7.5 MG tablet Take 1 tablet (7.5 mg total) by mouth daily. 05/23/15   Patrecia Pour, MD  metFORMIN (GLUCOPHAGE) 500 MG tablet Take 1 tablet (500 mg total) by mouth daily with breakfast. 05/18/15   Patrecia Pour, MD  methocarbamol (ROBAXIN) 500 MG tablet Take 1 tablet (500 mg total) by mouth 2 (two) times daily. Patient not taking: Reported on 02/01/2017 11/02/16   Jeannett Senior, PA-C  montelukast (SINGULAIR) 10 MG tablet Take 10 mg by mouth daily. 12/17/16   [provider]  Multiple Vitamin (MULTIVITAMIN WITH MINERALS) TABS tablet Take 1 tablet by mouth daily.    [provider]  omeprazole (PRILOSEC) 40 MG capsule Take 40 mg by mouth daily.    [provider]  potassium chloride SA (K-DUR,KLOR-CON) 20 MEQ tablet TAKE 1 TABLET BY MOUTH ONCE DAILY Patient not taking: Reported on 02/01/2017 05/18/15   Patrecia Pour, MD  predniSONE (DELTASONE) 20 MG tablet 2 tabs po daily x 4 days 02/01/17   Deno Etienne, DO  triamterene-hydrochlorothiazide (MAXZIDE-25) 37.5-25 MG  per tablet Take 1 tablet by mouth daily. 02/19/15   Patrecia Pour, MD  Vitamin D, Ergocalciferol, (DRISDOL) 50000 UNITS CAPS capsule Take 1 capsule (50,000 Units total) by mouth every 7 (seven) days. Patient not taking: Reported on 02/01/2017 03/01/15   Patrecia Pour, MD    Family History Family History  Problem Relation Age of Onset  . Cancer Maternal Grandmother        breast  . Carpal tunnel syndrome Sister     Social History Social History   Tobacco Use  . Smoking status: Current Every Day Smoker    Packs/day: 0.25    Types: Cigarettes  . Smokeless tobacco: Never Used  Substance Use  Topics  . Alcohol use: No    Alcohol/week: 0.0 standard drinks  . Drug use: No     Allergies   Lisinopril and Montelukast   Review of Systems Review of Systems  Constitutional: Negative for fever.  HENT: Positive for dental problem.   All other systems reviewed and are negative.    Physical Exam Updated Vital Signs BP (!) 144/105 (BP Location: Right Arm) Comment: pt states hx of HTN, on meds  Pulse (!) 120   Temp 99.2 F (37.3 C) (Oral)   Resp 15   Ht 5\' 6"  (1.676 m)   Wt 113.4 kg   SpO2 96%   BMI 40.35 kg/m   Physical Exam Vitals signs and nursing note reviewed.  Constitutional:      Appearance: Normal appearance.  HENT:     Mouth/Throat:     Dentition: Dental tenderness, gingival swelling and dental caries present. No dental abscesses.     Comments: Slight left lower  Neck:     Musculoskeletal: Normal range of motion and neck supple.  Cardiovascular:     Rate and Rhythm: Normal rate and regular rhythm.  Pulmonary:     Effort: Pulmonary effort is normal.     Breath sounds: Normal breath sounds.  Abdominal:     Palpations: Abdomen is soft.  Musculoskeletal: Normal range of motion.  Skin:    General: Skin is warm and dry.     Findings: No erythema.  Neurological:     General: No focal deficit present.     Mental Status: He is alert.  Psychiatric:        Mood and Affect: Mood normal.      ED Treatments / Results  Labs (all labs ordered are listed, but only abnormal results are displayed) Labs Reviewed - No data to display  EKG None  Radiology No results found.  Procedures Procedures (including critical care time)  Medications Ordered in ED Medications  amoxicillin (AMOXIL) capsule 1,000 mg (has no administration in time range)  ibuprofen (ADVIL) tablet 800 mg (has no administration in time range)  acetaminophen (TYLENOL) tablet 1,000 mg (has no administration in time range)     Initial Impression / Assessment and Plan / ED Course  I  have reviewed the triage vital signs and the nursing notes.  Pertinent labs & imaging results that were available during my care of the patient were reviewed by me and considered in my medical decision making (see chart for details).        Patient presents to the emergency department for evaluation of left-sided facial swelling associated with tooth ache.  He does not appear toxic, is afebrile.  He has minimal swelling of the left side of the mandible associated with multiple dental caries.  There is minimal gingival swelling  but no fluctuance area that is drainable.  Patient will be treated with antibiotics and follow-up with dentist.  Final Clinical Impressions(s) / ED Diagnoses   Final diagnoses:  Pain, dental    ED Discharge Orders         Ordered    amoxicillin (AMOXIL) 500 MG capsule  3 times daily     07/10/19 0036    ibuprofen (ADVIL) 800 MG tablet  Every 6 hours PRN     07/10/19 0036           Orpah Greek, MD 07/10/19 [id number]

## 2019-07-14 MED FILL — OMEPRAZOLE 40 MG CPDR: 40 | 90 days supply | Qty: 90 | Fill #1

## 2019-07-19 MED FILL — GABAPENTIN 300 MG CAPSULE: 300 | 30 days supply | Qty: 90 | Fill #1

## 2019-07-21 MED FILL — TRIAMTERENE-HCTZ 37.5-25 MG: 37.5-25 | 90 days supply | Qty: 90 | Fill #1

## 2019-07-27 MED FILL — IBUPROFEN 800 MG TAB: 800 | 4 days supply | Qty: 18 | Fill #0

## 2019-07-27 MED FILL — AMOXICILLIN 500 MG CAPSULE: 500 | 6 days supply | Qty: 18 | Fill #0

## 2019-08-13 MED FILL — HYDROCODON-APAP 5-325: 5-325 | 30 days supply | Qty: 90 | Fill #0

## 2019-08-16 MED FILL — GABAPENTIN 300 MG CAPSULE: 300 | 30 days supply | Qty: 90 | Fill #0

## 2019-09-13 MED FILL — MELOXICAM 7.5 MG TABLET: 7.5 | 90 days supply | Qty: 90 | Fill #1

## 2019-09-13 MED FILL — HYDROCODON-APAP 5-325: 5-325 | 30 days supply | Qty: 90 | Fill #0

## 2019-09-13 MED FILL — GABAPENTIN 300 MG CAPSULE: 300 | 90 days supply | Qty: 270 | Fill #0

## 2019-09-21 MED FILL — ATORVASTATIN 40 MG TABLET: 40 | 30 days supply | Qty: 30 | Fill #0

## 2019-10-11 MED FILL — HYDROCODON-APAP 5-325: 5-325 | 30 days supply | Qty: 90 | Fill #0

## 2019-10-11 MED FILL — metFORMIN HCL 1000 MG TABS: 1000 | 90 days supply | Qty: 180 | Fill #1

## 2019-11-01 MED FILL — TRIAMTERENE-HCTZ 37.5-25 MG: 37.5-25 | 90 days supply | Qty: 90 | Fill #0

## 2019-11-01 MED FILL — ATORVASTATIN 40 MG TABLET: 40 | 90 days supply | Qty: 90 | Fill #0

## 2019-11-01 MED FILL — OMEPRAZOLE 40 MG CPDR: 40 | 90 days supply | Qty: 90 | Fill #0

## 2019-11-08 MED FILL — MELOXICAM 15 MG TABLET: 15 | 30 days supply | Qty: 30 | Fill #0

## 2019-11-08 MED FILL — HYDROCODON-APAP 5-325: 5-325 | 30 days supply | Qty: 90 | Fill #0

## 2019-12-08 MED FILL — HYDROCODON-APAP 5-325: 5-325 | 30 days supply | Qty: 90 | Fill #0

## 2019-12-08 MED FILL — MELOXICAM 15 MG TABLET: 15 | 30 days supply | Qty: 30 | Fill #0

## 2019-12-28 ENCOUNTER — Other Ambulatory Visit (HOSPITAL_COMMUNITY): Payer: Self-pay | Admitting: Family Medicine

## 2019-12-28 MED FILL — MELOXICAM 7.5 MG TABLET: 7.5 | 90 days supply | Qty: 90 | Fill #0

## 2019-12-28 MED FILL — GABAPENTIN 300 MG CAPSULE: 300 | 90 days supply | Qty: 270 | Fill #0

## 2020-01-04 MED FILL — METFORMIN HCL 1000 MG TABS: 1000 | 90 days supply | Qty: 180 | Fill #0

## 2020-01-10 MED FILL — HYDROCODON-APAP 5-325: 5-325 | 30 days supply | Qty: 90 | Fill #0

## 2020-01-10 MED FILL — MELOXICAM 15 MG TABLET: 15 | 30 days supply | Qty: 30 | Fill #0

## 2020-01-10 MED FILL — OMEPRAZOLE 40 MG CPDR: 40 | 90 days supply | Qty: 90 | Fill #0

## 2020-02-02 MED FILL — ATORVASTATIN CALCIUM 40 MG: 40 | 90 days supply | Qty: 90 | Fill #0

## 2020-02-02 MED FILL — TRIAMTERENE-HCTZ 37.5-25 MG: 37.5-25 | 90 days supply | Qty: 90 | Fill #0

## 2020-02-07 ENCOUNTER — Other Ambulatory Visit (HOSPITAL_COMMUNITY): Payer: Self-pay | Admitting: Anesthesiology

## 2020-02-08 MED FILL — MELOXICAM 15 MG TABLET: 15 | 30 days supply | Qty: 30 | Fill #0

## 2020-02-08 MED FILL — HYDROCODON-APAP 5-325: 5-325 | 30 days supply | Qty: 90 | Fill #0

## 2020-03-06 MED FILL — MELOXICAM 15 MG TABLET: 15 | 30 days supply | Qty: 30 | Fill #0

## 2020-03-08 MED FILL — HYDROCODON-APAP 5-325: 5-325 | 30 days supply | Qty: 90 | Fill #0

## 2020-03-23 MED FILL — GABAPENTIN 300 MG CAPSULE: 300 | 90 days supply | Qty: 270 | Fill #1

## 2020-04-03 MED FILL — METFORMIN HCL 1000 MG TABS: 1000 | 90 days supply | Qty: 180 | Fill #1

## 2020-04-03 MED FILL — MELOXICAM 15 MG TABLET: 15 | 30 days supply | Qty: 30 | Fill #0

## 2020-04-07 MED FILL — HYDROCODON-APAP 5-325: 5-325 | 30 days supply | Qty: 90 | Fill #0

## 2020-04-21 MED FILL — OMEPRAZOLE 40 MG CPDR: 40 | 90 days supply | Qty: 90 | Fill #1

## 2020-04-26 MED FILL — ATORVASTATIN CALCIUM 40 MG: 40 | 90 days supply | Qty: 90 | Fill #1

## 2020-04-26 MED FILL — TRIAMTERENE-HCTZ 37.5-25 MG: 37.5-25 | 90 days supply | Qty: 90 | Fill #1

## 2020-05-01 MED FILL — MELOXICAM 15 MG TABLET: 15 | 30 days supply | Qty: 30 | Fill #0

## 2020-05-07 MED FILL — HYDROCODON-APAP 5-325: 5-325 | 30 days supply | Qty: 90 | Fill #0

## 2020-05-10 MED FILL — METHYLPREDNISOLONE 4 MG TBP: 4 | 6 days supply | Qty: 21 | Fill #0

## 2020-05-29 MED FILL — MELOXICAM 15 MG TABLET: 15 | 30 days supply | Qty: 30 | Fill #0

## 2020-06-06 MED FILL — HYDROCODON-APAP 5-325: 5-325 | 30 days supply | Qty: 120 | Fill #0

## 2020-06-19 MED FILL — GABAPENTIN 300 MG CAPSULE: 300 | 90 days supply | Qty: 270 | Fill #2

## 2020-07-02 MED FILL — METFORMIN HCL 1000 MG TABS: 1000 | 90 days supply | Qty: 180 | Fill #2

## 2020-07-03 ENCOUNTER — Other Ambulatory Visit (HOSPITAL_COMMUNITY): Payer: Self-pay | Admitting: Anesthesiology

## 2020-07-03 MED FILL — MELOXICAM 15 MG TABLET: 15 | 30 days supply | Qty: 30 | Fill #0

## 2020-07-04 MED FILL — HYDROCODON-APAP 5-325: 5-325 | 30 days supply | Qty: 120 | Fill #0

## 2020-07-18 MED FILL — OMEPRAZOLE 40 MG CPDR: 40 | 90 days supply | Qty: 90 | Fill #2

## 2020-07-26 MED FILL — TRIAMTERENE-HCTZ 37.5-25 MG: 37.5-25 | 90 days supply | Qty: 90 | Fill #2

## 2020-07-26 MED FILL — ATORVASTATIN 40 MG TABLET: 40 | 90 days supply | Qty: 90 | Fill #2

## 2020-07-31 ENCOUNTER — Other Ambulatory Visit (HOSPITAL_COMMUNITY): Payer: Self-pay | Admitting: Anesthesiology

## 2020-07-31 MED FILL — MELOXICAM 15 MG TABLET: 15 | 30 days supply | Qty: 30 | Fill #0

## 2020-08-03 MED FILL — HYDROCODON-APAP 5-325: 5-325 | 30 days supply | Qty: 120 | Fill #0

## 2020-08-28 ENCOUNTER — Other Ambulatory Visit (HOSPITAL_COMMUNITY): Payer: Self-pay | Admitting: Anesthesiology

## 2020-08-28 MED FILL — MELOXICAM 7.5 MG TABLET: 7.5 | 90 days supply | Qty: 90 | Fill #0

## 2020-08-30 MED FILL — HYDROCODON-APAP 5-325: 5-325 | 30 days supply | Qty: 120 | Fill #0

## 2020-09-18 MED FILL — GABAPENTIN 300 MG CAPSULE: 300 | 90 days supply | Qty: 270 | Fill #3

## 2020-09-25 ENCOUNTER — Other Ambulatory Visit (HOSPITAL_COMMUNITY): Payer: Self-pay | Admitting: Anesthesiology

## 2020-09-29 MED FILL — HYDROCODON-APAP 5-325: 5-325 | 30 days supply | Qty: 120 | Fill #0

## 2020-10-08 MED FILL — METFORMIN HCL 1000 MG TABS: 1000 | 90 days supply | Qty: 180 | Fill #3

## 2020-10-22 MED FILL — OMEPRAZOLE 40 MG CPDR: 40 | 90 days supply | Qty: 90 | Fill #3

## 2020-10-22 MED FILL — TRIAMTERENE-HCTZ 37.5-25 MG: 37.5-25 | 90 days supply | Qty: 90 | Fill #3

## 2020-10-22 MED FILL — ATORVASTATIN 40 MG TABLET: 40 | 90 days supply | Qty: 90 | Fill #3

## 2020-10-23 ENCOUNTER — Other Ambulatory Visit (HOSPITAL_COMMUNITY): Payer: Self-pay | Admitting: Anesthesiology

## 2020-10-27 MED FILL — HYDROCODON-APAP 5-325: 5-325 | 30 days supply | Qty: 120 | Fill #0

## 2020-10-27 MED FILL — MELOXICAM 7.5 MG TABLET: 7.5 | 90 days supply | Qty: 90 | Fill #1

## 2020-11-14 ENCOUNTER — Other Ambulatory Visit (HOSPITAL_COMMUNITY): Payer: Self-pay | Admitting: Nurse Practitioner

## 2020-11-15 ENCOUNTER — Other Ambulatory Visit (HOSPITAL_COMMUNITY): Payer: Self-pay | Admitting: Nurse Practitioner

## 2020-11-15 MED FILL — POTASSIUM CHLORIDE CRYS ER: 20 | 3 days supply | Qty: 3 | Fill #0

## 2020-11-20 ENCOUNTER — Other Ambulatory Visit (HOSPITAL_COMMUNITY): Payer: Self-pay | Admitting: Anesthesiology

## 2020-12-13 ENCOUNTER — Telehealth: Payer: Self-pay | Admitting: Nurse Practitioner

## 2020-12-13 NOTE — Telephone Encounter (Signed)
Patient called returning call, stated he would call back for appointment, doesn't need at the moment

## 2020-12-17 ENCOUNTER — Other Ambulatory Visit (HOSPITAL_COMMUNITY): Payer: Self-pay

## 2020-12-17 MED FILL — Gabapentin Cap 300 MG: ORAL | 90 days supply | Qty: 270 | Fill #0 | Status: AC

## 2020-12-17 MED FILL — Meloxicam Tab 7.5 MG: ORAL | 90 days supply | Qty: 90 | Fill #0 | Status: AC

## 2020-12-17 MED FILL — Meloxicam Tab 7.5 MG: ORAL | 90 days supply | Qty: 90 | Fill #0 | Status: CN

## 2020-12-18 ENCOUNTER — Other Ambulatory Visit (HOSPITAL_COMMUNITY): Payer: Self-pay

## 2020-12-18 MED ORDER — MELOXICAM 15 MG PO TABS
ORAL_TABLET | ORAL | 0 refills | Status: DC
  Filled 2020-12-18: qty 30, 30d supply, fill #0

## 2020-12-18 MED ORDER — HYDROCODONE-ACETAMINOPHEN 7.5-325 MG PO TABS
ORAL_TABLET | ORAL | 0 refills | Status: DC
  Filled 2020-12-18: qty 120, 30d supply, fill #0

## 2020-12-19 ENCOUNTER — Other Ambulatory Visit (HOSPITAL_COMMUNITY): Payer: Self-pay

## 2020-12-24 ENCOUNTER — Other Ambulatory Visit (HOSPITAL_COMMUNITY): Payer: Self-pay

## 2020-12-25 ENCOUNTER — Other Ambulatory Visit (HOSPITAL_COMMUNITY): Payer: Self-pay

## 2020-12-27 ENCOUNTER — Other Ambulatory Visit (HOSPITAL_COMMUNITY): Payer: Self-pay

## 2020-12-27 MED FILL — Omeprazole Cap Delayed Release 40 MG: ORAL | 90 days supply | Qty: 90 | Fill #0 | Status: AC

## 2020-12-27 MED FILL — Metformin HCl Tab 1000 MG: ORAL | 90 days supply | Qty: 180 | Fill #0 | Status: AC

## 2021-01-05 ENCOUNTER — Other Ambulatory Visit (HOSPITAL_COMMUNITY): Payer: Self-pay

## 2021-01-16 ENCOUNTER — Other Ambulatory Visit (HOSPITAL_COMMUNITY): Payer: Self-pay

## 2021-01-16 MED ORDER — MELOXICAM 15 MG PO TABS
15.0000 mg | ORAL_TABLET | Freq: Every day | ORAL | 0 refills | Status: DC
  Filled 2021-01-16: qty 30, 30d supply, fill #0

## 2021-01-16 MED ORDER — HYDROCODONE-ACETAMINOPHEN 7.5-325 MG PO TABS
ORAL_TABLET | ORAL | 0 refills | Status: DC
  Filled 2021-01-16: qty 120, 30d supply, fill #0

## 2021-01-24 ENCOUNTER — Other Ambulatory Visit (HOSPITAL_COMMUNITY): Payer: Self-pay

## 2021-01-25 ENCOUNTER — Other Ambulatory Visit (HOSPITAL_COMMUNITY): Payer: Self-pay

## 2021-01-25 MED FILL — Triamterene & Hydrochlorothiazide Tab 37.5-25 MG: ORAL | 90 days supply | Qty: 90 | Fill #0 | Status: AC

## 2021-01-25 MED FILL — Atorvastatin Calcium Tab 40 MG (Base Equivalent): ORAL | 90 days supply | Qty: 90 | Fill #0 | Status: AC

## 2021-02-12 ENCOUNTER — Other Ambulatory Visit (HOSPITAL_COMMUNITY): Payer: Self-pay

## 2021-02-12 MED ORDER — MELOXICAM 15 MG PO TABS
ORAL_TABLET | ORAL | 0 refills | Status: DC
  Filled 2021-02-12: qty 30, 30d supply, fill #0

## 2021-02-12 MED ORDER — HYDROCODONE-ACETAMINOPHEN 7.5-325 MG PO TABS
ORAL_TABLET | ORAL | 0 refills | Status: DC
  Filled 2021-02-12 – 2021-02-14 (×2): qty 120, 30d supply, fill #0

## 2021-02-14 ENCOUNTER — Other Ambulatory Visit (HOSPITAL_COMMUNITY): Payer: Self-pay

## 2021-02-19 ENCOUNTER — Other Ambulatory Visit (HOSPITAL_COMMUNITY): Payer: Self-pay

## 2021-02-19 MED ORDER — HYDROCODONE-ACETAMINOPHEN 10-325 MG PO TABS
ORAL_TABLET | ORAL | 0 refills | Status: DC
  Filled 2021-02-19: qty 12, 3d supply, fill #0

## 2021-03-13 ENCOUNTER — Other Ambulatory Visit (HOSPITAL_COMMUNITY): Payer: Self-pay

## 2021-03-13 MED ORDER — HYDROCODONE-ACETAMINOPHEN 7.5-325 MG PO TABS
ORAL_TABLET | ORAL | 0 refills | Status: DC
  Filled 2021-03-13 – 2021-03-16 (×2): qty 60, 15d supply, fill #0

## 2021-03-13 MED ORDER — MELOXICAM 15 MG PO TABS: ORAL_TABLET | ORAL | 0 refills | Status: DC

## 2021-03-14 ENCOUNTER — Other Ambulatory Visit: Payer: Self-pay

## 2021-03-14 ENCOUNTER — Other Ambulatory Visit (HOSPITAL_COMMUNITY): Payer: Self-pay

## 2021-03-14 ENCOUNTER — Ambulatory Visit (HOSPITAL_COMMUNITY)
Admission: RE | Admit: 2021-03-14 | Discharge: 2021-03-14 | Disposition: A | Discharge status: Home / Self Care | Payer: Medicare Other | Source: Ambulatory Visit | Attending: Orthopedic Surgery | Admitting: Orthopedic Surgery

## 2021-03-14 ENCOUNTER — Other Ambulatory Visit (HOSPITAL_COMMUNITY): Payer: Self-pay | Admitting: Orthopedic Surgery

## 2021-03-14 DIAGNOSIS — R52 Pain, unspecified: Secondary | ICD-10-CM

## 2021-03-16 ENCOUNTER — Other Ambulatory Visit (HOSPITAL_COMMUNITY): Payer: Self-pay

## 2021-03-16 MED FILL — Meloxicam Tab 7.5 MG: ORAL | 90 days supply | Qty: 90 | Fill #1 | Status: AC

## 2021-03-16 MED FILL — Gabapentin Cap 300 MG: ORAL | 90 days supply | Qty: 270 | Fill #1 | Status: AC

## 2021-03-18 ENCOUNTER — Other Ambulatory Visit (HOSPITAL_COMMUNITY): Payer: Self-pay

## 2021-03-18 MED FILL — Metformin HCl Tab 1000 MG: ORAL | 90 days supply | Qty: 180 | Fill #1 | Status: AC

## 2021-03-21 ENCOUNTER — Other Ambulatory Visit: Payer: Self-pay | Admitting: Orthopedic Surgery

## 2021-03-21 DIAGNOSIS — M25512 Pain in left shoulder: Secondary | ICD-10-CM

## 2021-03-23 ENCOUNTER — Other Ambulatory Visit: Payer: Self-pay

## 2021-03-23 ENCOUNTER — Ambulatory Visit
Admission: RE | Admit: 2021-03-23 | Discharge: 2021-03-23 | Disposition: A | Discharge status: Home / Self Care | Payer: Medicare Other | Source: Ambulatory Visit | Attending: Orthopedic Surgery | Admitting: Orthopedic Surgery

## 2021-03-23 DIAGNOSIS — M25512 Pain in left shoulder: Secondary | ICD-10-CM

## 2021-03-28 ENCOUNTER — Ambulatory Visit (INDEPENDENT_AMBULATORY_CARE_PROVIDER_SITE_OTHER): Payer: Medicare Other | Admitting: Podiatry

## 2021-03-28 ENCOUNTER — Other Ambulatory Visit: Payer: Self-pay

## 2021-03-28 ENCOUNTER — Other Ambulatory Visit (HOSPITAL_COMMUNITY): Payer: Self-pay

## 2021-03-28 ENCOUNTER — Encounter: Payer: Self-pay | Admitting: Podiatry

## 2021-03-28 DIAGNOSIS — B351 Tinea unguium: Secondary | ICD-10-CM

## 2021-03-28 MED ORDER — FLUCONAZOLE 150 MG PO TABS
450.0000 mg | ORAL_TABLET | ORAL | 0 refills | Status: DC
  Filled 2021-03-28: qty 12, 28d supply, fill #0
  Filled 2021-04-19: qty 12, 28d supply, fill #1

## 2021-03-28 MED ORDER — FLUCONAZOLE 150 MG PO TABS: 450.0000 mg | ORAL_TABLET | ORAL | 0 refills | Status: DC

## 2021-03-29 ENCOUNTER — Other Ambulatory Visit (HOSPITAL_COMMUNITY): Payer: Self-pay

## 2021-04-01 ENCOUNTER — Other Ambulatory Visit (HOSPITAL_COMMUNITY): Payer: Self-pay

## 2021-04-01 MED ORDER — POTASSIUM CHLORIDE ER 20 MEQ PO TBCR
EXTENDED_RELEASE_TABLET | ORAL | 3 refills | Status: DC
  Filled 2021-04-01: qty 90, 90d supply, fill #0
  Filled 2021-06-10: qty 90, 90d supply, fill #1
  Filled 2021-09-13: qty 90, 90d supply, fill #2
  Filled 2021-12-03: qty 90, 90d supply, fill #3

## 2021-04-01 MED ORDER — FREESTYLE LIBRE 14 DAY SENSOR MISC
11 refills | Status: DC
  Filled 2021-04-01: qty 2, 28d supply, fill #0

## 2021-04-01 MED ORDER — FREESTYLE LIBRE 14 DAY READER DEVI
0 refills | Status: AC
  Filled 2021-04-01: qty 1, 30d supply, fill #0

## 2021-04-01 NOTE — Progress Notes (Signed)
  Subjective:  Patient ID: ANDERSSON SCHLOTTER, male    DOB: Jul 20, 1976,  MRN: [id number]  Chief Complaint  Patient presents with   Nail Problem    NP  thick painful toenails    45 y.o. male presents with the above complaint. History confirmed with patient.  He said this for quite some time.  His A1c is 6.7%.  His right is worse than the left.  Objective:  Physical Exam: warm, good capillary refill, no trophic changes or ulcerative lesions, normal DP and PT pulses, and normal sensory exam. Left Foot: dystrophic yellowed discolored nail plates with subungual debris Right Foot: dystrophic yellowed discolored nail plates with subungual debris      Assessment:   1. Onychomycosis      Plan:  Patient was evaluated and treated and all questions answered.  Discussed oral topical and laser treatment options for onychomycosis.  I recommend oral therapy.  Prescription for fluconazole for 450 mg pulse dosing weekly with sent to his pharmacy.  We discussed possible risks, benefits and side effects of treatment.  Photographs were taken.  Follow-up in 6 months  Return in about 6 months (around 09/28/2021).

## 2021-04-02 ENCOUNTER — Other Ambulatory Visit (HOSPITAL_COMMUNITY): Payer: Self-pay

## 2021-04-02 MED ORDER — KLOXXADO 8 MG/0.1ML NA LIQD
NASAL | 0 refills | Status: DC
  Filled 2021-04-02 – 2021-04-19 (×2): qty 2, 30d supply, fill #0

## 2021-04-02 MED ORDER — HYDROCODONE-ACETAMINOPHEN 7.5-325 MG PO TABS
ORAL_TABLET | ORAL | 0 refills | Status: DC
  Filled 2021-04-02: qty 120, 30d supply, fill #0

## 2021-04-02 MED ORDER — MELOXICAM 15 MG PO TABS
ORAL_TABLET | ORAL | 0 refills | Status: DC
  Filled 2021-04-02: qty 30, 30d supply, fill #0

## 2021-04-10 ENCOUNTER — Other Ambulatory Visit (HOSPITAL_COMMUNITY): Payer: Self-pay

## 2021-04-19 ENCOUNTER — Other Ambulatory Visit (HOSPITAL_COMMUNITY): Payer: Self-pay

## 2021-04-19 ENCOUNTER — Other Ambulatory Visit: Payer: Self-pay | Admitting: Podiatry

## 2021-04-19 MED FILL — Atorvastatin Calcium Tab 40 MG (Base Equivalent): ORAL | 90 days supply | Qty: 90 | Fill #1 | Status: AC

## 2021-04-19 MED FILL — Omeprazole Cap Delayed Release 40 MG: ORAL | 90 days supply | Qty: 90 | Fill #1 | Status: AC

## 2021-04-19 MED FILL — Triamterene & Hydrochlorothiazide Tab 37.5-25 MG: ORAL | 90 days supply | Qty: 90 | Fill #1 | Status: AC

## 2021-04-30 ENCOUNTER — Other Ambulatory Visit (HOSPITAL_COMMUNITY): Payer: Self-pay

## 2021-04-30 ENCOUNTER — Other Ambulatory Visit: Payer: Self-pay

## 2021-04-30 MED ORDER — MELOXICAM 15 MG PO TABS
ORAL_TABLET | ORAL | 0 refills | Status: DC
  Filled 2021-04-30: qty 30, 30d supply, fill #0

## 2021-04-30 MED ORDER — HYDROCODONE-ACETAMINOPHEN 7.5-325 MG PO TABS
ORAL_TABLET | ORAL | 0 refills | Status: DC
  Filled 2021-05-02: qty 120, 30d supply, fill #0

## 2021-05-02 ENCOUNTER — Other Ambulatory Visit (HOSPITAL_COMMUNITY): Payer: Self-pay

## 2021-05-03 ENCOUNTER — Other Ambulatory Visit (HOSPITAL_COMMUNITY): Payer: Self-pay

## 2021-05-06 ENCOUNTER — Other Ambulatory Visit (HOSPITAL_COMMUNITY): Payer: Self-pay

## 2021-05-08 ENCOUNTER — Other Ambulatory Visit (HOSPITAL_COMMUNITY): Payer: Self-pay

## 2021-05-10 ENCOUNTER — Telehealth: Payer: Self-pay | Admitting: Podiatry

## 2021-05-10 NOTE — Telephone Encounter (Signed)
Patient called to get a refill on Diflucan

## 2021-05-11 ENCOUNTER — Other Ambulatory Visit (HOSPITAL_COMMUNITY): Payer: Self-pay

## 2021-05-14 ENCOUNTER — Telehealth: Payer: Self-pay | Admitting: Podiatry

## 2021-05-14 MED ORDER — FLUCONAZOLE 150 MG PO TABS: 450.0000 mg | ORAL_TABLET | ORAL | 4 refills | Status: DC

## 2021-05-14 NOTE — Telephone Encounter (Signed)
Patient is requesting a refill of his pain medication.   Please advise.Marland KitchenMarland Kitchen

## 2021-05-14 NOTE — Telephone Encounter (Signed)
Refill sent for 12 tablets and 4 refills (They only would fill 12 at a time for him) for diflucan

## 2021-05-14 NOTE — Addendum Note (Signed)
Addended bySherryle Lis, Lailyn Appelbaum R on: 05/14/2021 05:44 PM   Modules accepted: Orders

## 2021-05-15 ENCOUNTER — Other Ambulatory Visit: Payer: Self-pay | Admitting: Podiatry

## 2021-05-15 ENCOUNTER — Other Ambulatory Visit: Payer: Self-pay

## 2021-05-15 ENCOUNTER — Other Ambulatory Visit (HOSPITAL_COMMUNITY): Payer: Self-pay

## 2021-05-15 MED FILL — Fluconazole Tab 150 MG: ORAL | 84 days supply | Qty: 36 | Fill #0 | Status: AC

## 2021-05-15 NOTE — Telephone Encounter (Signed)
Please advise 

## 2021-05-15 NOTE — Telephone Encounter (Signed)
Patient has called back about refill on medication

## 2021-05-16 ENCOUNTER — Other Ambulatory Visit (HOSPITAL_COMMUNITY): Payer: Self-pay

## 2021-05-17 ENCOUNTER — Other Ambulatory Visit (HOSPITAL_COMMUNITY): Payer: Self-pay

## 2021-05-23 ENCOUNTER — Other Ambulatory Visit (HOSPITAL_COMMUNITY): Payer: Self-pay

## 2021-05-23 MED ORDER — GABAPENTIN 300 MG PO CAPS
ORAL_CAPSULE | ORAL | 3 refills | Status: DC
  Filled 2021-05-23 – 2021-05-28 (×2): qty 270, 90d supply, fill #0

## 2021-05-28 ENCOUNTER — Other Ambulatory Visit (HOSPITAL_COMMUNITY): Payer: Self-pay

## 2021-05-28 ENCOUNTER — Other Ambulatory Visit: Payer: Self-pay

## 2021-05-28 MED ORDER — HYDROCODONE-ACETAMINOPHEN 7.5-325 MG PO TABS
ORAL_TABLET | ORAL | 0 refills | Status: DC
  Filled 2021-06-01: qty 120, 30d supply, fill #0

## 2021-05-28 MED ORDER — MELOXICAM 15 MG PO TABS: ORAL_TABLET | ORAL | 0 refills | Status: DC

## 2021-05-28 MED FILL — Meloxicam Tab 7.5 MG: ORAL | 90 days supply | Qty: 90 | Fill #2 | Status: AC

## 2021-05-29 ENCOUNTER — Other Ambulatory Visit (HOSPITAL_COMMUNITY): Payer: Self-pay

## 2021-05-30 ENCOUNTER — Other Ambulatory Visit (HOSPITAL_COMMUNITY): Payer: Self-pay

## 2021-06-01 ENCOUNTER — Other Ambulatory Visit (HOSPITAL_COMMUNITY): Payer: Self-pay

## 2021-06-01 ENCOUNTER — Other Ambulatory Visit: Payer: Self-pay

## 2021-06-03 ENCOUNTER — Ambulatory Visit (HOSPITAL_COMMUNITY)
Admission: EM | Admit: 2021-06-03 | Discharge: 2021-06-03 | Disposition: A | Discharge status: Home / Self Care | Payer: Medicare Other

## 2021-06-03 ENCOUNTER — Other Ambulatory Visit: Payer: Self-pay

## 2021-06-03 ENCOUNTER — Encounter (HOSPITAL_COMMUNITY): Payer: Self-pay

## 2021-06-03 DIAGNOSIS — U071 COVID-19: Secondary | ICD-10-CM | POA: Diagnosis not present

## 2021-06-03 MED ORDER — BENZONATATE 100 MG PO CAPS: 100.0000 mg | ORAL_CAPSULE | Freq: Three times a day (TID) | ORAL | 0 refills | Status: DC

## 2021-06-03 MED ORDER — PROMETHAZINE-DM 6.25-15 MG/5ML PO SYRP: 5.0000 mL | ORAL_SOLUTION | Freq: Four times a day (QID) | ORAL | 0 refills | Status: DC | PRN

## 2021-06-03 NOTE — ED Provider Notes (Signed)
[place]    CSN: [id number] Arrival date & time: 06/03/21  1709      History   Chief Complaint Chief Complaint  Patient presents with   Covid Positive   Cough    HPI Antonio Huber is a 45 y.o. male.   Patient here for evaluation of cough, congestion, body aches, and headaches for the past 3 days.  Reports testing positive for COVID at another facility on Saturday.  Has not taken any OTC medications or treatments.  Requesting a Z-Pak or antibiotics to help with symptoms.  Denies any trauma, injury, or other precipitating event.  Denies any specific alleviating or aggravating factors.  Denies any fevers, chest pain, shortness of breath, N/V/D, numbness, tingling, weakness, abdominal pain, or headaches.    The history is provided by the patient.  Cough Associated symptoms: myalgias    Past Medical History:  Diagnosis Date   Diabetes mellitus without complication (HCC)    Type 2   High cholesterol    Hypertension    Musculoskeletal pain    after multiple traumatic events    Patient Active Problem List   Diagnosis Date Noted   Medial knee pain 03/19/2015   Hypertriglyceridemia 03/01/2015   Dyslipidemia associated with type 2 diabetes mellitus ([place]) 03/01/2015   Vitamin D deficiency 02/19/2015   T2DM (type 2 diabetes mellitus) ([place]) 02/19/2015   Shoulder pain 06/11/2012   Chronic back pain 06/07/2012   Frequent urination 06/07/2012   Obesity 06/07/2012   HTN (hypertension) 06/07/2012    History reviewed. No pertinent surgical history.     Home Medications    Prior to Admission medications   Medication Sig Start Date End Date Taking? Authorizing Provider  benzonatate (TESSALON) 100 MG capsule Take 1 capsule (100 mg total) by mouth every 8 (eight) hours. 06/03/21  Yes Pearson Forster, NP  HYDROcodone-acetaminophen (NORCO) 7.5-325 MG tablet Take 1 tablet by mouth every 6 (six) hours as needed for moderate pain.   Yes [provider]   promethazine-dextromethorphan (PROMETHAZINE-DM) 6.25-15 MG/5ML syrup Take 5 mLs by mouth 4 (four) times daily as needed for cough. 06/03/21  Yes Pearson Forster, NP  atorvastatin (LIPITOR) 40 MG tablet TAKE 1 TABLET BY MOUTH DAILY 11/14/20 11/14/21  Lily Peer A, FNP  atorvastatin (LIPITOR) 40 MG tablet TAKE 1 TABLET BY MOUTH ONCE DAILY 12/28/19 12/27/20  Vassie Moment, MD  Cholecalciferol (VITAMIN D) 50 MCG (2000 UT) tablet TAKE 1 TABLET BY MOUTH ONCE A DAY AFTER A MEAL 11/14/20 11/14/21  Delford Field, FNP  Continuous Blood Gluc Receiver (FREESTYLE LIBRE 14 DAY READER) DEVI Use as directed, check fasting glucose daily 04/01/21     Continuous Blood Gluc Sensor (FREESTYLE LIBRE 14 DAY SENSOR) MISC Use as directed, check fasting glucose daily as directed. 04/01/21     fluconazole (DIFLUCAN) 150 MG tablet Take 3 tablets (450 mg total) by mouth once a week for 26 doses. 05/15/21   McDonald, Stephan Minister, DPM  gabapentin (NEURONTIN) 300 MG capsule Take 2 capsules (600 mg total) by mouth 3 (three) times daily. Patient taking differently: Take 300 mg by mouth 3 (three) times daily.  04/12/15   Patrecia Pour, MD  gabapentin (NEURONTIN) 300 MG capsule Take 1 capsule by mouth three times daily. 05/23/21     ibuprofen (ADVIL) 800 MG tablet Take 1 tablet (800 mg total) by mouth every 6 (six) hours as needed for moderate pain. 07/10/19   Orpah Greek, MD  meloxicam (MOBIC) 15  MG tablet Take 1 tab(s) orally once a day. 03/13/21     meloxicam (MOBIC) 15 MG tablet Take 1 tablet by mouth once a day 04/02/21     meloxicam (MOBIC) 15 MG tablet Take 1 tablet by mouth daily 04/30/21     meloxicam (MOBIC) 15 MG tablet Take 1 tablet by mouth once a day 05/28/21     meloxicam (MOBIC) 7.5 MG tablet TAKE 1 TABLET BY MOUTH ONCE A DAY 11/14/20 11/14/21  Lily Peer A, FNP  metFORMIN (GLUCOPHAGE) 1000 MG tablet TAKE 1 TABLET BY MOUTH 2 TIMES PER DAY WITH MORNING AND EVENING MEALS 12/28/19 12/27/20  Vassie Moment, MD  montelukast  (SINGULAIR) 10 MG tablet Take 10 mg by mouth daily. 12/17/16   [provider]  Multiple Vitamin (MULTIVITAMIN WITH MINERALS) TABS tablet Take 1 tablet by mouth daily.    [provider]  Naloxone HCl (KLOXXADO) 8 MG/0.1ML LIQD Use as directed when needed for opioid emergency 04/02/21     omeprazole (PRILOSEC) 40 MG capsule TAKE 1 CAPSULE BY MOUTH ONCE A DAY BEFORE A MEAL 11/14/20 11/14/21  Lily Peer A, FNP  omeprazole (PRILOSEC) 40 MG capsule TAKE 1 CAPSULE BY MOUTH DAILY 12/28/19 12/27/20  Vassie Moment, MD  Potassium Chloride ER 20 MEQ TBCR Take 1 tablet by mouth once daily with food 04/01/21     triamterene-hydrochlorothiazide (MAXZIDE-25) 37.5-25 MG tablet TAKE 1 TABLET BY MOUTH ONCE A DAY 11/14/20 11/14/21  Delford Field, FNP  triamterene-hydrochlorothiazide (MAXZIDE-25) 37.5-25 MG tablet TAKE 1 TABLET BY MOUTH DAILY 12/28/19 12/27/20  Vassie Moment, MD  Vitamin D, Ergocalciferol, (DRISDOL) 50000 UNITS CAPS capsule Take 1 capsule (50,000 Units total) by mouth every 7 (seven) days. Patient not taking: Reported on 02/01/2017 03/01/15   Patrecia Pour, MD    Family History Family History  Problem Relation Age of Onset   Cancer Maternal Grandmother        breast   Carpal tunnel syndrome Sister     Social History Social History   Tobacco Use   Smoking status: Every Day    Packs/day: 0.25    Types: Cigarettes   Smokeless tobacco: Never  Vaping Use   Vaping Use: Never used  Substance Use Topics   Alcohol use: No    Alcohol/week: 0.0 standard drinks   Drug use: No     Allergies   Lisinopril and Montelukast   Review of Systems Review of Systems  HENT:  Positive for congestion.   Respiratory:  Positive for cough.   Musculoskeletal:  Positive for myalgias.  All other systems reviewed and are negative.   Physical Exam Triage Vital Signs ED Triage Vitals [06/03/21 1833]  Enc Vitals Group     BP 121/64     Pulse Rate 93     Resp 18     Temp 98.4 F (36.9  C)     Temp Source Oral     SpO2 94 %     Weight      Height      Head Circumference      Peak Flow      Pain Score 0     Pain Loc      Pain Edu?      Excl. in [place]?    No data found.  Updated Vital Signs BP 121/64 (BP Location: Right Arm)   Pulse 93   Temp 98.4 F (36.9 C) (Oral)   Resp 18   SpO2 94%   Visual Acuity Right  Eye Distance:   Left Eye Distance:   Bilateral Distance:    Right Eye Near:   Left Eye Near:    Bilateral Near:     Physical Exam Vitals and nursing note reviewed.  Constitutional:      General: He is not in acute distress.    Appearance: Normal appearance. He is not ill-appearing, toxic-appearing or diaphoretic.  HENT:     Head: Normocephalic and atraumatic.  Eyes:     Conjunctiva/sclera: Conjunctivae normal.  Cardiovascular:     Rate and Rhythm: Normal rate.     Pulses: Normal pulses.     Heart sounds: Normal heart sounds.  Pulmonary:     Effort: Pulmonary effort is normal.     Breath sounds: Normal breath sounds.  Abdominal:     General: Abdomen is flat.  Musculoskeletal:        General: Normal range of motion.     Cervical back: Normal range of motion.  Skin:    General: Skin is warm and dry.  Neurological:     General: No focal deficit present.     Mental Status: He is alert and oriented to person, place, and time.  Psychiatric:        Mood and Affect: Mood normal.     UC Treatments / Results  Labs (all labs ordered are listed, but only abnormal results are displayed) Labs Reviewed - No data to display  EKG   Radiology No results found.  Procedures Procedures (including critical care time)  Medications Ordered in UC Medications - No data to display  Initial Impression / Assessment and Plan / UC Course  I have reviewed the triage vital signs and the nursing notes.  Pertinent labs & imaging results that were available during my care of the patient were reviewed by me and considered in my medical decision making  (see chart for details).    Assessment negative for red flags or concerns.  Discussed with patient that as this is a viral illness antibiotics such as a Z-Pak will not be beneficial.  Reports that symptoms started sometime last week so patient is likely outside of the 5-day window for antiviral treatment.  Discussed CDC guidelines for quarantine.  May take Tessalon Perles and Promethazine DM as needed for cough.  Instructed not to take Promethazine DM prior to driving as it can cause drowsiness.  Encourage fluids and rest.  Tylenol and/or ibuprofen as needed.  Discussed conservative symptom management as described in discharge instructions.  Follow-up as needed Final Clinical Impressions(s) / UC Diagnoses   Final diagnoses:  [id number]     Discharge Instructions      Please quarantine for at least 5 days from your symptom onset or until you do not have a fever without the use of medications for at least 24 hours.  You can take the [facility] as needed for cough. You can take the Promethazine DM as needed for cough at night.  This can make you sleepy so do not take it before driving.  You can take Tylenol and/or Ibuprofen as needed for fever reduction and pain relief.   For cough: honey 1/2 to 1 teaspoon (you can dilute the honey in water or another fluid).  You can also use guaifenesin and dextromethorphan for cough. You can use a humidifier for chest congestion and cough.  If you don't have a humidifier, you can sit in the bathroom with the hot shower running.     For sore throat: try  warm salt water gargles, cepacol lozenges, throat spray, warm tea or water with lemon/honey, popsicles or ice, or OTC cold relief medicine for throat discomfort.    For congestion: take a daily anti-histamine like Zyrtec, Claritin, and a oral decongestant, such as pseudoephedrine.  You can also use Flonase 1-2 sprays in each nostril daily.    It is important to stay hydrated: drink plenty of fluids  (water, gatorade/powerade/pedialyte, juices, or teas) to keep your throat moisturized and help further relieve irritation/discomfort.   Return or go to the Emergency Department if symptoms worsen or do not improve in the next few days.      ED Prescriptions     Medication Sig Dispense Auth. Provider   promethazine-dextromethorphan (PROMETHAZINE-DM) 6.25-15 MG/5ML syrup Take 5 mLs by mouth 4 (four) times daily as needed for cough. 118 mL Pearson Forster, NP   benzonatate (TESSALON) 100 MG capsule Take 1 capsule (100 mg total) by mouth every 8 (eight) hours. 21 capsule Pearson Forster, NP      PDMP not reviewed this encounter.   Pearson Forster, NP 06/03/21 1904

## 2021-06-03 NOTE — Discharge Instructions (Addendum)
Please quarantine for at least 5 days from your symptom onset or until you do not have a fever without the use of medications for at least 24 hours.  You can take the [facility] as needed for cough. You can take the Promethazine DM as needed for cough at night.  This can make you sleepy so do not take it before driving.  You can take Tylenol and/or Ibuprofen as needed for fever reduction and pain relief.   For cough: honey 1/2 to 1 teaspoon (you can dilute the honey in water or another fluid).  You can also use guaifenesin and dextromethorphan for cough. You can use a humidifier for chest congestion and cough.  If you don't have a humidifier, you can sit in the bathroom with the hot shower running.     For sore throat: try warm salt water gargles, cepacol lozenges, throat spray, warm tea or water with lemon/honey, popsicles or ice, or OTC cold relief medicine for throat discomfort.    For congestion: take a daily anti-histamine like Zyrtec, Claritin, and a oral decongestant, such as pseudoephedrine.  You can also use Flonase 1-2 sprays in each nostril daily.    It is important to stay hydrated: drink plenty of fluids (water, gatorade/powerade/pedialyte, juices, or teas) to keep your throat moisturized and help further relieve irritation/discomfort.   Return or go to the Emergency Department if symptoms worsen or do not improve in the next few days.

## 2021-06-03 NOTE — ED Triage Notes (Signed)
Pt c/o cough, chest congestion, eye pressure, and runny nose x3 days. States had a positive covid test at [facility] on Saturday.

## 2021-06-10 ENCOUNTER — Other Ambulatory Visit (HOSPITAL_COMMUNITY): Payer: Self-pay

## 2021-06-15 ENCOUNTER — Other Ambulatory Visit (HOSPITAL_COMMUNITY): Payer: Self-pay

## 2021-06-20 ENCOUNTER — Other Ambulatory Visit (HOSPITAL_COMMUNITY): Payer: Self-pay

## 2021-06-21 ENCOUNTER — Other Ambulatory Visit (HOSPITAL_COMMUNITY): Payer: Self-pay

## 2021-06-21 MED ORDER — METFORMIN HCL 1000 MG PO TABS
ORAL_TABLET | ORAL | 0 refills | Status: AC
  Filled 2021-06-21: qty 180, 90d supply, fill #0

## 2021-06-21 MED ORDER — METFORMIN HCL 1000 MG PO TABS
ORAL_TABLET | ORAL | 3 refills | Status: DC
  Filled 2021-06-21: qty 180, 90d supply, fill #0

## 2021-06-22 ENCOUNTER — Other Ambulatory Visit (HOSPITAL_COMMUNITY): Payer: Self-pay

## 2021-06-25 ENCOUNTER — Other Ambulatory Visit (HOSPITAL_COMMUNITY): Payer: Self-pay

## 2021-06-25 MED ORDER — HYDROCODONE-ACETAMINOPHEN 7.5-325 MG PO TABS
ORAL_TABLET | ORAL | 0 refills | Status: DC
  Filled 2021-06-25 – 2021-07-01 (×2): qty 120, 30d supply, fill #0

## 2021-06-25 MED ORDER — MELOXICAM 15 MG PO TABS
ORAL_TABLET | ORAL | 0 refills | Status: DC
  Filled 2021-06-25: qty 30, 30d supply, fill #0

## 2021-06-27 ENCOUNTER — Ambulatory Visit: Payer: Medicare Other

## 2021-06-27 ENCOUNTER — Other Ambulatory Visit (HOSPITAL_COMMUNITY): Payer: Self-pay

## 2021-06-27 DIAGNOSIS — Z1211 Encounter for screening for malignant neoplasm of colon: Secondary | ICD-10-CM

## 2021-06-27 NOTE — Progress Notes (Signed)
No egg or soy allergy known to patient   No issues with past sedation with any surgeries or procedures  Patient denies ever being told they had issues or difficulty with intubation  No FH of Malignant Hyperthermia No diet pills per patient No home 02 use per patient  No blood thinners per patient  Pt denies issues with constipation  No A fib or A flutter   COVID 19 guidelines implemented in [place] today with Pt and RN  Pt is vaccinated  for Covid      Due to the COVID-19 pandemic we are asking patients to follow certain guidelines.  Pt aware of COVID protocols and [place] guidelines   Patient does indicate that he may have difficulty with transportation and having someone 18 or older available to stay with him for his procedure. He indicates that his wife would be the only one who could come but they have a premature infant and do not want to risk getting him sick so he is unsure the wife can come. He does not have much social support beyond the wife. He also indicates that he would have to ride the bus to get to and from procedure. I have provided him with transportation/caregiver informatino from [facility] as an option and have reiterated to him that unfortunately, if he does not have someone with him for the procedure, we will be unable to complete the procedure. He verbalizes understanding and indicates that he will work on this.

## 2021-06-28 ENCOUNTER — Other Ambulatory Visit (HOSPITAL_COMMUNITY): Payer: Self-pay

## 2021-06-28 ENCOUNTER — Encounter: Payer: Self-pay | Admitting: *Deleted

## 2021-06-28 ENCOUNTER — Telehealth: Payer: Self-pay | Admitting: Gastroenterology

## 2021-06-28 MED ORDER — POLYETHYLENE GLYCOL 3350 17 GM/SCOOP PO POWD
ORAL | 0 refills | Status: DC
  Filled 2021-06-28: qty 238, fill #0

## 2021-06-28 NOTE — Telephone Encounter (Signed)
Patient has a procedure scheduled for 07/05/21 requesting for someone to send his prep medication so he can pick it up and prepare with time. Please call him once done.

## 2021-06-28 NOTE — Progress Notes (Signed)
I have contacted patient again to advise that unfortunately, the prep that we went over yesterday showed a possible interaction with his blood pressure medicine (maxide) so we need to change the prep. I advised that instead, we will have him complete a miralax prep. Instructions have been sent to patient's mychart account and a miralax prescription has been sent to patient's pharmacy. Patient verbalizes understanding of this information.

## 2021-07-01 ENCOUNTER — Other Ambulatory Visit (HOSPITAL_COMMUNITY): Payer: Self-pay

## 2021-07-05 ENCOUNTER — Encounter: Payer: Self-pay | Admitting: Gastroenterology

## 2021-07-05 ENCOUNTER — Ambulatory Visit (AMBULATORY_SURGERY_CENTER): Payer: Medicare Other | Admitting: Gastroenterology

## 2021-07-05 VITALS — BP 108/64 | HR 80 | Temp 98.2°F | Resp 16 | Ht 64.5 in | Wt 250.0 lb

## 2021-07-05 DIAGNOSIS — Z1211 Encounter for screening for malignant neoplasm of colon: Secondary | ICD-10-CM | POA: Diagnosis not present

## 2021-07-05 MED ORDER — SODIUM CHLORIDE 0.9 % IV SOLN: 500.0000 mL | Freq: Once | INTRAVENOUS | Status: AC

## 2021-07-05 NOTE — Op Note (Signed)
[place] Patient Name: Antonio Huber Procedure Date: 07/05/2021 9:33 AM MRN: [id number] Endoscopist: Mauri Pole , MD Age: 45 Referring MD:  Date of Birth: 11/01/1975 Gender: Male Account #: [id number] Procedure:                Colonoscopy Indications:              Screening for colorectal malignant neoplasm Medicines:                Monitored Anesthesia Care Procedure:                Pre-Anesthesia Assessment:                           - Prior to the procedure, a History and Physical                            was performed, and patient medications and                            allergies were reviewed. The patient's tolerance of                            previous anesthesia was also reviewed. The risks                            and benefits of the procedure and the sedation                            options and risks were discussed with the patient.                            All questions were answered, and informed consent                            was obtained. Prior Anticoagulants: The patient has                            taken no previous anticoagulant or antiplatelet                            agents. ASA Grade Assessment: III - A patient with                            severe systemic disease. After reviewing the risks                            and benefits, the patient was deemed in                            satisfactory condition to undergo the procedure.                           After obtaining informed consent, the colonoscope  was passed under direct vision. Throughout the                            procedure, the patient's blood pressure, pulse, and                            oxygen saturations were monitored continuously. The                            Olympus [id number]-[id number]) Colonoscope was                            introduced through the anus and advanced to the the                            cecum,  identified by appendiceal orifice and                            ileocecal valve. The colonoscopy was performed                            without difficulty. The patient tolerated the                            procedure well. The quality of the bowel                            preparation was adequate after lavage and suction.                            The ileocecal valve, appendiceal orifice, and                            rectum were photographed. Scope In: 9:51:22 AM Scope Out: 10:07:54 AM Scope Withdrawal Time: 0 hours 6 minutes 39 seconds  Total Procedure Duration: 0 hours 16 minutes 32 seconds  Findings:                 The perianal and digital rectal examinations were                            normal.                           Non-bleeding external and internal hemorrhoids were                            found during retroflexion. The hemorrhoids were                            medium-sized.                           The exam was otherwise without abnormality. Complications:            No immediate complications. Estimated Blood Loss:  Estimated blood loss was minimal. Impression:               - Non-bleeding external and internal hemorrhoids.                           - The examination was otherwise normal.                           - No specimens collected. Recommendation:           - Patient has a contact number available for                            emergencies. The signs and symptoms of potential                            delayed complications were discussed with the                            patient. Return to normal activities tomorrow.                            Written discharge instructions were provided to the                            patient.                           - Resume previous diet.                           - Continue present medications.                           - Repeat colonoscopy in 10 years for screening                             purposes. Mauri Pole, MD 07/05/2021 10:19:25 AM This report has been signed electronically.

## 2021-07-05 NOTE — Progress Notes (Signed)
[facility] History and Physical   Primary Care Physician:  Medicine, [facility]   Reason for Procedure:  Colorectal cancer screening  Plan:    Screening colonoscopy with possible interventions as needed     HPI: Antonio Huber is a very pleasant 45 y.o. male here for screening colonoscopy. Denies any nausea, vomiting, abdominal pain, melena or bright red blood per rectum  The risks and benefits as well as alternatives of endoscopic procedure(s) have been discussed and reviewed. All questions answered. The patient agrees to proceed.    Past Medical History:  Diagnosis Date   Diabetes mellitus without complication (HCC)    Type 2   High cholesterol    Hypertension    Musculoskeletal pain    after multiple traumatic events    Past Surgical History:  Procedure Laterality Date   boil surgery     WISDOM TOOTH EXTRACTION      Prior to Admission medications   Medication Sig Start Date End Date Taking? Authorizing Provider  atorvastatin (LIPITOR) 40 MG tablet TAKE 1 TABLET BY MOUTH DAILY 11/14/20 11/14/21 Yes Lily Peer A, FNP  Cholecalciferol (VITAMIN D) 50 MCG (2000 UT) tablet TAKE 1 TABLET BY MOUTH ONCE A DAY AFTER A MEAL 11/14/20 11/14/21 Yes Lily Peer A, FNP  fluconazole (DIFLUCAN) 150 MG tablet Take 3 tablets (450 mg total) by mouth once a week for 26 doses. 05/15/21  Yes McDonald, Stephan Minister, DPM  gabapentin (NEURONTIN) 300 MG capsule Take 2 capsules (600 mg total) by mouth 3 (three) times daily. Patient taking differently: Take 300 mg by mouth 3 (three) times daily. 04/12/15  Yes Patrecia Pour, MD  HYDROcodone-acetaminophen (NORCO) 7.5-325 MG tablet take 1 tablet by mouth 4 times a day for pain. 06/25/21  Yes   meloxicam (MOBIC) 7.5 MG tablet TAKE 1 TABLET BY MOUTH ONCE A DAY 11/14/20 11/14/21 Yes Lily Peer A, FNP  metFORMIN (GLUCOPHAGE) 1000 MG tablet TAKE 1 TABLET BY MOUTH 2 TIMES PER DAY WITH MORNING AND EVENING MEALS 06/21/21  06/21/22 Yes   Multiple Vitamin (MULTIVITAMIN WITH MINERALS) TABS tablet Take 1 tablet by mouth daily.   Yes [provider]  omeprazole (PRILOSEC) 40 MG capsule TAKE 1 CAPSULE BY MOUTH ONCE A DAY BEFORE A MEAL 11/14/20 11/14/21 Yes Lily Peer A, FNP  Potassium Chloride ER 20 MEQ TBCR Take 1 tablet by mouth once daily with food 04/01/21  Yes   triamterene-hydrochlorothiazide (MAXZIDE-25) 37.5-25 MG tablet TAKE 1 TABLET BY MOUTH ONCE A DAY 11/14/20 11/14/21 Yes Lily Peer A, FNP  Continuous Blood Gluc Receiver (FREESTYLE LIBRE 14 DAY READER) DEVI Use as directed, check fasting glucose daily Patient not taking: Reported on 07/05/2021 04/01/21     Continuous Blood Gluc Sensor (FREESTYLE LIBRE 14 DAY SENSOR) MISC Use as directed, check fasting glucose daily as directed. 04/01/21     Naloxone HCl (KLOXXADO) 8 MG/0.1ML LIQD Use as directed when needed for opioid emergency Patient not taking: No sig reported 04/02/21       Current Outpatient Medications  Medication Sig Dispense Refill   atorvastatin (LIPITOR) 40 MG tablet TAKE 1 TABLET BY MOUTH DAILY 90 tablet 3   Cholecalciferol (VITAMIN D) 50 MCG (2000 UT) tablet TAKE 1 TABLET BY MOUTH ONCE A DAY AFTER A MEAL 100 tablet 3   fluconazole (DIFLUCAN) 150 MG tablet Take 3 tablets (450 mg total) by mouth once a week for 26 doses. 78 tablet 0   gabapentin (NEURONTIN) 300 MG capsule Take 2 capsules (  600 mg total) by mouth 3 (three) times daily. (Patient taking differently: Take 300 mg by mouth 3 (three) times daily.) 180 capsule 1   HYDROcodone-acetaminophen (NORCO) 7.5-325 MG tablet take 1 tablet by mouth 4 times a day for pain. 120 tablet 0   meloxicam (MOBIC) 7.5 MG tablet TAKE 1 TABLET BY MOUTH ONCE A DAY 90 tablet 3   metFORMIN (GLUCOPHAGE) 1000 MG tablet TAKE 1 TABLET BY MOUTH 2 TIMES PER DAY WITH MORNING AND EVENING MEALS 180 tablet 0   Multiple Vitamin (MULTIVITAMIN WITH MINERALS) TABS tablet Take 1 tablet by mouth daily.     omeprazole  (PRILOSEC) 40 MG capsule TAKE 1 CAPSULE BY MOUTH ONCE A DAY BEFORE A MEAL 90 capsule 3   Potassium Chloride ER 20 MEQ TBCR Take 1 tablet by mouth once daily with food 90 tablet 3   triamterene-hydrochlorothiazide (MAXZIDE-25) 37.5-25 MG tablet TAKE 1 TABLET BY MOUTH ONCE A DAY 90 tablet 3   Continuous Blood Gluc Receiver (FREESTYLE LIBRE 14 DAY READER) DEVI Use as directed, check fasting glucose daily (Patient not taking: Reported on 07/05/2021) 1 each 0   Continuous Blood Gluc Sensor (FREESTYLE LIBRE 14 DAY SENSOR) MISC Use as directed, check fasting glucose daily as directed. 2 each 11   Naloxone HCl (KLOXXADO) 8 MG/0.1ML LIQD Use as directed when needed for opioid emergency (Patient not taking: No sig reported) 2 each 0   Current Facility-Administered Medications  Medication Dose Route Frequency Provider Last Rate Last Admin   0.9 %  sodium chloride infusion  500 mL Intravenous Once Mauri Pole, MD        Allergies as of 07/05/2021 - Review Complete 07/05/2021  Allergen Reaction Noted   Lisinopril Shortness Of Breath, Swelling, and Other (See Comments) 01/20/2014   Montelukast Anaphylaxis 07/09/2019    Family History  Problem Relation Age of Onset   Carpal tunnel syndrome Sister    Breast cancer Maternal Grandmother    Colon cancer Neg Hx    Esophageal cancer Neg Hx     Social History   Socioeconomic History   Marital status: Married    Spouse name: Not on file   Number of children: Not on file   Years of education: Not on file   Highest education level: Not on file  Occupational History   Not on file  Tobacco Use   Smoking status: Every Day    Packs/day: 0.25    Types: Cigarettes   Smokeless tobacco: Never  Vaping Use   Vaping Use: Never used  Substance and Sexual Activity   Alcohol use: No    Alcohol/week: 0.0 standard drinks   Drug use: No   Sexual activity: Yes  Other Topics Concern   Not on file  Social History Narrative   Not on file   Social  Determinants of Health   Financial Resource Strain: Not on file  Food Insecurity: Not on file  Transportation Needs: Not on file  Physical Activity: Not on file  Stress: Not on file  Social Connections: Not on file  Intimate Partner Violence: Not on file    Review of Systems:  All other review of systems negative except as mentioned in the HPI.  Physical Exam: Vital signs in last 24 hours: BP 130/90   Pulse (!) 103   Temp 98.2 F (36.8 C) (Skin)   Ht 5' 4.5" (1.638 m)   Wt 250 lb (113.4 kg)   SpO2 100%   BMI 42.25 kg/m  General:   Alert, NAD Lungs:  Clear .   Heart:  Regular rate and rhythm Abdomen:  Soft, nontender and nondistended. Neuro/Psych:  Alert and cooperative. Normal mood and affect. A and O x 3  Reviewed labs, radiology imaging, old records and pertinent past GI work up  Patient is appropriate for planned procedure(s) and anesthesia in an ambulatory setting   K. Denzil Magnuson , MD [phone]

## 2021-07-05 NOTE — Progress Notes (Signed)
Pt's states no medical or surgical changes since previsit or office visit. VS assessed by D.T 

## 2021-07-05 NOTE — Progress Notes (Signed)
Sedate, gd SR, tolerated procedure well, VSS, report to RN 

## 2021-07-05 NOTE — Patient Instructions (Addendum)
Thank you for allowing [place] to care for you today! Recommend next screening  colonoscopy in 10 years. Resume previous diet and medications today.  Return to your normal daily activities tomorrow, 07/06/21.  Try Metamucil daily for more regular bowel movements.  Drink at least 64 ounces of water/fluids per day. If you continue to have issues, you may consider daily stool softeners or Miralax. All of these products are over-the counter.    YOU HAD AN ENDOSCOPIC PROCEDURE TODAY AT [facility]:   Refer to the procedure report that was given to you for any specific questions about what was found during the examination.  If the procedure report does not answer your questions, please call your gastroenterologist to clarify.  If you requested that your care partner not be given the details of your procedure findings, then the procedure report has been included in a sealed envelope for you to review at your convenience later.  YOU SHOULD EXPECT: Some feelings of bloating in the abdomen. Passage of more gas than usual.  Walking can help get rid of the air that was put into your GI tract during the procedure and reduce the bloating. If you had a lower endoscopy (such as a colonoscopy or flexible sigmoidoscopy) you may notice spotting of blood in your stool or on the toilet paper. If you underwent a bowel prep for your procedure, you may not have a normal bowel movement for a few days.  Please Note:  You might notice some irritation and congestion in your nose or some drainage.  This is from the oxygen used during your procedure.  There is no need for concern and it should clear up in a day or so.  SYMPTOMS TO REPORT IMMEDIATELY:  Following lower endoscopy (colonoscopy or flexible sigmoidoscopy):  Excessive amounts of blood in the stool  Significant tenderness or worsening of abdominal pains  Swelling of the abdomen that is new, acute  Fever of 100F or higher   For urgent or emergent  issues, a gastroenterologist can be reached at any hour by calling [phone]. Do not use MyChart messaging for urgent concerns.    DIET:  We do recommend a small meal at first, but then you may proceed to your regular diet.  Drink plenty of fluids but you should avoid alcoholic beverages for 24 hours.  ACTIVITY:  You should plan to take it easy for the rest of today and you should NOT DRIVE or use heavy machinery until tomorrow (because of the sedation medicines used during the test).    FOLLOW UP: Our staff will call the number listed on your records 48-72 hours following your procedure to check on you and address any questions or concerns that you may have regarding the information given to you following your procedure. If we do not reach you, we will leave a message.  We will attempt to reach you two times.  During this call, we will ask if you have developed any symptoms of COVID 19. If you develop any symptoms (ie: fever, flu-like symptoms, shortness of breath, cough etc.) before then, please call [phone].  If you test positive for Covid 19 in the 2 weeks post procedure, please call and report this information to [place].    If any biopsies were taken you will be contacted by phone or by letter within the next 1-3 weeks.  Please call us at [phone] if you have not heard about the biopsies in 3 weeks.  SIGNATURES/CONFIDENTIALITY: You and/or your care partner have signed paperwork which will be entered into your electronic medical record.  These signatures attest to the fact that that the information above on your After Visit Summary has been reviewed and is understood.  Full responsibility of the confidentiality of this discharge information lies with you and/or your care-partner.

## 2021-07-09 ENCOUNTER — Telehealth: Payer: Self-pay

## 2021-07-09 NOTE — Telephone Encounter (Signed)
  Follow up Call-  Call back number 07/05/2021  Post procedure Call Back phone  # [phone]  Permission to leave phone message Yes  Some recent data might be hidden     Patient questions:  Do you have a fever, pain , or abdominal swelling? No. Pain Score  0 *  Have you tolerated food without any problems? Yes.    Have you been able to return to your normal activities? Yes.    Do you have any questions about your discharge instructions: Diet   No. Medications  No. Follow up visit  No.  Do you have questions or concerns about your Care? No.  Actions: * If pain score is 4 or above: No action needed, pain <4.

## 2021-07-20 ENCOUNTER — Other Ambulatory Visit (HOSPITAL_COMMUNITY): Payer: Self-pay

## 2021-07-20 MED FILL — Atorvastatin Calcium Tab 40 MG (Base Equivalent): ORAL | 90 days supply | Qty: 90 | Fill #2 | Status: AC

## 2021-07-20 MED FILL — Fluconazole Tab 150 MG: ORAL | 84 days supply | Qty: 36 | Fill #1 | Status: CN

## 2021-07-20 MED FILL — Omeprazole Cap Delayed Release 40 MG: ORAL | 90 days supply | Qty: 90 | Fill #2 | Status: AC

## 2021-07-23 ENCOUNTER — Other Ambulatory Visit (HOSPITAL_COMMUNITY): Payer: Self-pay

## 2021-07-23 MED FILL — Fluconazole Tab 150 MG: ORAL | 7 days supply | Qty: 3 | Fill #1 | Status: AC

## 2021-07-23 MED FILL — Triamterene & Hydrochlorothiazide Tab 37.5-25 MG: ORAL | 90 days supply | Qty: 90 | Fill #2 | Status: AC

## 2021-07-24 ENCOUNTER — Other Ambulatory Visit (HOSPITAL_COMMUNITY): Payer: Self-pay

## 2021-07-24 MED FILL — Fluconazole Tab 150 MG: ORAL | 77 days supply | Qty: 33 | Fill #1 | Status: AC

## 2021-07-25 ENCOUNTER — Other Ambulatory Visit (HOSPITAL_COMMUNITY): Payer: Self-pay

## 2021-07-29 ENCOUNTER — Other Ambulatory Visit (HOSPITAL_COMMUNITY): Payer: Self-pay

## 2021-07-30 ENCOUNTER — Other Ambulatory Visit (HOSPITAL_COMMUNITY): Payer: Self-pay

## 2021-07-30 MED ORDER — HYDROCODONE-ACETAMINOPHEN 7.5-325 MG PO TABS
ORAL_TABLET | ORAL | 0 refills | Status: DC
  Filled 2021-07-30: qty 120, 30d supply, fill #0

## 2021-07-30 MED ORDER — MELOXICAM 15 MG PO TABS: ORAL_TABLET | ORAL | 0 refills | Status: DC

## 2021-08-10 ENCOUNTER — Other Ambulatory Visit (HOSPITAL_COMMUNITY): Payer: Self-pay

## 2021-08-20 ENCOUNTER — Other Ambulatory Visit (HOSPITAL_COMMUNITY): Payer: Self-pay

## 2021-08-20 MED ORDER — DEXCOM G6 RECEIVER DEVI
0 refills | Status: DC
  Filled 2021-08-20: qty 1, 90d supply, fill #0

## 2021-08-20 MED ORDER — DEXCOM G6 SENSOR MISC
5 refills | Status: DC
  Filled 2021-08-20: qty 3, 30d supply, fill #0

## 2021-08-21 ENCOUNTER — Other Ambulatory Visit (HOSPITAL_COMMUNITY): Payer: Self-pay

## 2021-08-24 ENCOUNTER — Encounter (HOSPITAL_COMMUNITY): Payer: Self-pay | Admitting: Emergency Medicine

## 2021-08-24 ENCOUNTER — Other Ambulatory Visit: Payer: Self-pay

## 2021-08-24 ENCOUNTER — Emergency Department (HOSPITAL_COMMUNITY)
Admission: EM | Admit: 2021-08-24 | Discharge: 2021-08-24 | Disposition: A | Discharge status: Home / Self Care | Payer: Medicare Other | Attending: Emergency Medicine | Admitting: Emergency Medicine

## 2021-08-24 DIAGNOSIS — Z20822 Contact with and (suspected) exposure to covid-19: Secondary | ICD-10-CM | POA: Insufficient documentation

## 2021-08-24 DIAGNOSIS — I10 Essential (primary) hypertension: Secondary | ICD-10-CM | POA: Insufficient documentation

## 2021-08-24 DIAGNOSIS — R1110 Vomiting, unspecified: Secondary | ICD-10-CM | POA: Diagnosis not present

## 2021-08-24 DIAGNOSIS — Z7984 Long term (current) use of oral hypoglycemic drugs: Secondary | ICD-10-CM | POA: Insufficient documentation

## 2021-08-24 DIAGNOSIS — E119 Type 2 diabetes mellitus without complications: Secondary | ICD-10-CM | POA: Insufficient documentation

## 2021-08-24 DIAGNOSIS — F17210 Nicotine dependence, cigarettes, uncomplicated: Secondary | ICD-10-CM | POA: Diagnosis not present

## 2021-08-24 DIAGNOSIS — R197 Diarrhea, unspecified: Secondary | ICD-10-CM | POA: Diagnosis not present

## 2021-08-24 LAB — COMPREHENSIVE METABOLIC PANEL
ALT: 22 U/L (ref 0–44)
AST: 24 U/L (ref 15–41)
Albumin: 4.2 g/dL (ref 3.5–5.0)
Alkaline Phosphatase: 68 U/L (ref 38–126)
Anion gap: 13 (ref 5–15)
BUN: 16 mg/dL (ref 6–20)
CO2: 21 mmol/L — ABNORMAL LOW (ref 22–32)
Calcium: 9.3 mg/dL (ref 8.9–10.3)
Chloride: 104 mmol/L (ref 98–111)
Creatinine, Ser: 1.24 mg/dL (ref 0.61–1.24)
GFR, Estimated: >60 mL/min (ref >60)
Glucose, Bld: 117 mg/dL — ABNORMAL HIGH (ref 70–99)
Potassium: 3.4 mmol/L — ABNORMAL LOW (ref 3.5–5.1)
Sodium: 138 mmol/L (ref 135–145)
Total Bilirubin: 0.8 mg/dL (ref 0.3–1.2)
Total Protein: 7.8 g/dL (ref 6.5–8.1)

## 2021-08-24 LAB — RESP PANEL BY RT-PCR (FLU A&B, COVID) ARPGX2
Influenza A by PCR: NEGATIVE
Influenza B by PCR: NEGATIVE
SARS Coronavirus 2 by RT PCR: NEGATIVE

## 2021-08-24 LAB — CBC
HCT: 45.7 % (ref 39.0–52.0)
Hemoglobin: 15 g/dL (ref 13.0–17.0)
MCH: 30.9 pg (ref 26.0–34.0)
MCHC: 32.8 g/dL (ref 30.0–36.0)
MCV: 94.2 fL (ref 80.0–100.0)
Platelets: 226 10*3/uL (ref 150–400)
RBC: 4.85 MIL/uL (ref 4.22–5.81)
RDW: 12.4 % (ref 11.5–15.5)
WBC: 8.5 10*3/uL (ref 4.0–10.5)
nRBC: 0 % (ref 0.0–0.2)

## 2021-08-24 LAB — LIPASE, BLOOD: Lipase: 31 U/L (ref 11–51)

## 2021-08-24 MED ORDER — ONDANSETRON HCL 4 MG PO TABS: 4.0000 mg | ORAL_TABLET | Freq: Four times a day (QID) | ORAL | 0 refills | Status: DC

## 2021-08-24 MED ORDER — ONDANSETRON 4 MG PO TBDP: 4.0000 mg | ORAL_TABLET | Freq: Once | ORAL | Status: DC | PRN

## 2021-08-24 NOTE — ED Triage Notes (Signed)
Patient arrives complaining of vomiting and diarrhea that began today. Patient's son has been sick since Monday with a dry cough. Patient denies other symptoms.

## 2021-08-24 NOTE — ED Provider Notes (Signed)
[facility] DEPT Provider Note   CSN: [id number] Arrival date & time: 08/24/21  0422     History Chief Complaint  Patient presents with   Vomiting   Diarrhea    Quintavis O Seabrook is a 45 y.o. male with a past medical history of diabetes, hypertension who presents to the ED complaining of emesis onset yesterday.  Patient reports sick contacts of his son.  He has associated diarrhea.  He has tried over-the-counter medications with mild relief of his symptoms.  He denies chest pain, shortness of breath, fever, chills, nausea, hematochezia, hemataemesis.    The history is provided by the patient. No language interpreter was used.      Past Medical History:  Diagnosis Date   Diabetes mellitus without complication (HCC)    Type 2   High cholesterol    Hypertension    Musculoskeletal pain    after multiple traumatic events    Patient Active Problem List   Diagnosis Date Noted   Medial knee pain 03/19/2015   Hypertriglyceridemia 03/01/2015   Dyslipidemia associated with type 2 diabetes mellitus ([place]) 03/01/2015   Vitamin D deficiency 02/19/2015   T2DM (type 2 diabetes mellitus) ([place]) 02/19/2015   Shoulder pain 06/11/2012   Chronic back pain 06/07/2012   Frequent urination 06/07/2012   Obesity 06/07/2012   HTN (hypertension) 06/07/2012    Past Surgical History:  Procedure Laterality Date   boil surgery     WISDOM TOOTH EXTRACTION         Family History  Problem Relation Age of Onset   Carpal tunnel syndrome Sister    Breast cancer Maternal Grandmother    Colon cancer Neg Hx    Esophageal cancer Neg Hx     Social History   Tobacco Use   Smoking status: Every Day    Packs/day: 0.25    Types: Cigarettes   Smokeless tobacco: Never  Vaping Use   Vaping Use: Never used  Substance Use Topics   Alcohol use: No    Alcohol/week: 0.0 standard drinks   Drug use: No    Home Medications Prior to Admission medications   Medication  Sig Start Date End Date Taking? Authorizing Provider  ondansetron (ZOFRAN) 4 MG tablet Take 1 tablet (4 mg total) by mouth every 6 (six) hours. Take 1 tablet by mouth every 6 hours if experiencing nausea or vomiting 08/24/21  Yes Nyela Cortinas A, PA-C  atorvastatin (LIPITOR) 40 MG tablet TAKE 1 TABLET BY MOUTH DAILY 11/14/20 11/14/21  Lily Peer A, FNP  Cholecalciferol (VITAMIN D) 50 MCG (2000 UT) tablet TAKE 1 TABLET BY MOUTH ONCE A DAY AFTER A MEAL 11/14/20 11/14/21  Delford Field, FNP  Continuous Blood Gluc Receiver (DEXCOM G6 RECEIVER) DEVI Use as directed to check fasting glucose 2 times a day 08/20/21     Continuous Blood Gluc Receiver (FREESTYLE LIBRE 14 DAY READER) DEVI Use as directed, check fasting glucose daily Patient not taking: Reported on 07/05/2021 04/01/21     Continuous Blood Gluc Sensor (DEXCOM G6 SENSOR) MISC Use as directed to check fasting glucose 2 times a day 08/20/21     fluconazole (DIFLUCAN) 150 MG tablet Take 3 tablets (450 mg total) by mouth once a week for 26 doses. 05/15/21   McDonald, Stephan Minister, DPM  gabapentin (NEURONTIN) 300 MG capsule Take 2 capsules (600 mg total) by mouth 3 (three) times daily. Patient taking differently: Take 300 mg by mouth 3 (three) times daily. 04/12/15   Bonner Puna,  Meredith Leeds, MD  HYDROcodone-acetaminophen (NORCO) 7.5-325 MG tablet Take 1 tablet by mouth 4 times a day for pain. 07/30/21     meloxicam (MOBIC) 15 MG tablet Take 1 tablet by mouth once a day 07/30/21     meloxicam (MOBIC) 7.5 MG tablet TAKE 1 TABLET BY MOUTH ONCE A DAY 11/14/20 11/14/21  Lily Peer A, FNP  metFORMIN (GLUCOPHAGE) 1000 MG tablet TAKE 1 TABLET BY MOUTH 2 TIMES PER DAY WITH MORNING AND EVENING MEALS 06/21/21 06/21/22    Multiple Vitamin (MULTIVITAMIN WITH MINERALS) TABS tablet Take 1 tablet by mouth daily.    [provider]  Naloxone HCl (KLOXXADO) 8 MG/0.1ML LIQD Use as directed when needed for opioid emergency Patient not taking: No sig reported 04/02/21      omeprazole (PRILOSEC) 40 MG capsule TAKE 1 CAPSULE BY MOUTH ONCE A DAY BEFORE A MEAL 11/14/20 11/14/21  Lily Peer A, FNP  Potassium Chloride ER 20 MEQ TBCR Take 1 tablet by mouth once daily with food 04/01/21     triamterene-hydrochlorothiazide (MAXZIDE-25) 37.5-25 MG tablet TAKE 1 TABLET BY MOUTH ONCE A DAY 11/14/20 11/14/21  Lily Peer A, FNP    Allergies    Lisinopril and Montelukast  Review of Systems   Review of Systems  Constitutional:  Negative for chills and fever.  Respiratory:  Negative for cough and shortness of breath.   Cardiovascular:  Negative for chest pain.  Gastrointestinal:  Positive for abdominal pain, diarrhea and vomiting. Negative for blood in stool.  Skin:  Negative for rash.  All other systems reviewed and are negative.  Physical Exam Updated Vital Signs BP 115/87 (BP Location: Right Arm)    Pulse 100    Temp 98.8 F (37.1 C) (Oral)    Resp 18    Ht 5\' 5"  (1.651 m)    Wt 113.4 kg    SpO2 99%    BMI 41.60 kg/m   Physical Exam Vitals and nursing note reviewed.  Constitutional:      General: He is not in acute distress.    Appearance: He is not diaphoretic.  HENT:     Head: Normocephalic and atraumatic.     Mouth/Throat:     Pharynx: No oropharyngeal exudate.  Eyes:     General: No scleral icterus.    Conjunctiva/sclera: Conjunctivae normal.  Cardiovascular:     Rate and Rhythm: Normal rate and regular rhythm.     Pulses: Normal pulses.     Heart sounds: Normal heart sounds.  Pulmonary:     Effort: Pulmonary effort is normal. No respiratory distress.     Breath sounds: Normal breath sounds. No wheezing.  Abdominal:     General: Bowel sounds are normal.     Palpations: Abdomen is soft. There is no mass.     Tenderness: There is no abdominal tenderness. There is no guarding or rebound.     Comments: No abdominal tenderness to palpation.  Musculoskeletal:        General: Normal range of motion.     Cervical back: Normal range of motion and  neck supple.  Skin:    General: Skin is warm and dry.  Neurological:     Mental Status: He is alert.  Psychiatric:        Behavior: Behavior normal.    ED Results / Procedures / Treatments   Labs (all labs ordered are listed, but only abnormal results are displayed) Labs Reviewed  COMPREHENSIVE METABOLIC PANEL - Abnormal; Notable for the following components:  Result Value   Potassium 3.4 (*)    CO2 21 (*)    Glucose, Bld 117 (*)    All other components within normal limits  RESP PANEL BY RT-PCR (FLU A&B, COVID) ARPGX2  LIPASE, BLOOD  CBC    EKG None  Radiology No results found.  Procedures Procedures   Medications Ordered in ED Medications  ondansetron (ZOFRAN-ODT) disintegrating tablet 4 mg (has no administration in time range)    ED Course  I have reviewed the triage vital signs and the nursing notes.  Pertinent labs & imaging results that were available during my care of the patient were reviewed by me and considered in my medical decision making (see chart for details).    MDM Rules/Calculators/A&P                         Patient presents to the ED with vomiting and diarrhea onset yesterday.  Patient has sick contacts of his son.  Vital signs stable, patient afebrile, oxygen saturation 99%.  On exam patient without acute cardiovascular, respiratory, abdominal exam findings.  Differential diagnosis includes COVID, flu, gastroenteritis.  Lipase, CBC, BMP unremarkable.  COVID and flu swab negative.  This is likely viral in etiology.  Will provide Zofran prescription to use as needed for nausea and vomiting.  Supportive care measures and strict return precautions discussed with patient.  Patient acknowledges and verbalizes understanding.  Patient appears safe for discharge at this time.  Follow-up as indicated in discharge paperwork.   Final Clinical Impression(s) / ED Diagnoses Final diagnoses:  Vomiting and diarrhea    Rx / DC Orders ED Discharge Orders           Ordered    ondansetron (ZOFRAN) 4 MG tablet  Every 6 hours        08/24/21 0823             Tamera Pingley A, PA-C 08/24/21 1113    Valarie Merino, MD 08/24/21 1440

## 2021-08-24 NOTE — Discharge Instructions (Addendum)
Your COVID and flu swab was negative today.  You will be sent a prescription for Zofran, it is to be used if you feel nauseous or have vomited.  Ensure to maintain fluid intake.  You may follow-up with your primary care provider as needed.  Return to the emergency department if you are experiencing increasing/worsening decreased fluid intake, abdominal pain, fever, or worsening symptoms.

## 2021-08-27 ENCOUNTER — Other Ambulatory Visit (HOSPITAL_COMMUNITY): Payer: Self-pay

## 2021-08-27 MED ORDER — MELOXICAM 15 MG PO TABS
ORAL_TABLET | ORAL | 0 refills | Status: DC
  Filled 2021-08-27: qty 30, 30d supply, fill #0

## 2021-08-27 MED ORDER — HYDROCODONE-ACETAMINOPHEN 7.5-325 MG PO TABS
ORAL_TABLET | ORAL | 0 refills | Status: DC
  Filled 2021-08-27 – 2021-08-29 (×2): qty 120, 30d supply, fill #0

## 2021-08-29 ENCOUNTER — Other Ambulatory Visit (HOSPITAL_COMMUNITY): Payer: Self-pay

## 2021-09-09 ENCOUNTER — Other Ambulatory Visit (HOSPITAL_COMMUNITY): Payer: Self-pay

## 2021-09-10 ENCOUNTER — Other Ambulatory Visit (HOSPITAL_COMMUNITY): Payer: Self-pay

## 2021-09-13 ENCOUNTER — Other Ambulatory Visit (HOSPITAL_COMMUNITY): Payer: Self-pay

## 2021-09-13 ENCOUNTER — Other Ambulatory Visit: Payer: Self-pay

## 2021-09-13 MED FILL — Meloxicam Tab 7.5 MG: ORAL | 90 days supply | Qty: 90 | Fill #3 | Status: AC

## 2021-09-14 ENCOUNTER — Other Ambulatory Visit (HOSPITAL_COMMUNITY): Payer: Self-pay

## 2021-09-16 ENCOUNTER — Other Ambulatory Visit (HOSPITAL_COMMUNITY): Payer: Self-pay

## 2021-09-18 ENCOUNTER — Other Ambulatory Visit (HOSPITAL_COMMUNITY): Payer: Self-pay

## 2021-09-19 ENCOUNTER — Other Ambulatory Visit (HOSPITAL_COMMUNITY): Payer: Self-pay

## 2021-09-19 MED ORDER — ATORVASTATIN CALCIUM 40 MG PO TABS
ORAL_TABLET | ORAL | 3 refills | Status: DC
  Filled 2021-09-19 – 2021-10-24 (×2): qty 90, 90d supply, fill #0
  Filled 2022-01-10: qty 90, 90d supply, fill #1
  Filled 2022-04-15: qty 90, 90d supply, fill #2
  Filled 2022-07-01: qty 90, 90d supply, fill #3

## 2021-09-19 MED ORDER — GABAPENTIN 300 MG PO CAPS
ORAL_CAPSULE | ORAL | 3 refills | Status: DC
  Filled 2021-09-19: qty 270, 90d supply, fill #0
  Filled 2021-11-26 – 2021-12-03 (×2): qty 270, 90d supply, fill #1
  Filled 2022-02-21: qty 270, 90d supply, fill #2
  Filled 2022-05-20: qty 270, 90d supply, fill #3

## 2021-09-19 MED ORDER — FREESTYLE LIBRE 14 DAY SENSOR MISC
11 refills | Status: DC
  Filled 2021-09-19: qty 2, 30d supply, fill #0
  Filled 2021-09-20 – 2021-09-27 (×2): qty 2, 28d supply, fill #0

## 2021-09-19 MED ORDER — MELOXICAM 7.5 MG PO TABS
ORAL_TABLET | ORAL | 3 refills | Status: DC
  Filled 2021-11-26 – 2021-12-03 (×2): qty 90, 90d supply, fill #0
  Filled 2022-02-21: qty 90, 90d supply, fill #1
  Filled 2022-05-01: qty 90, 90d supply, fill #2

## 2021-09-19 MED ORDER — FREESTYLE LIBRE 14 DAY READER DEVI
0 refills | Status: DC
  Filled 2021-09-19: qty 1, 30d supply, fill #0
  Filled 2021-09-20: qty 1, 28d supply, fill #0

## 2021-09-20 ENCOUNTER — Other Ambulatory Visit (HOSPITAL_COMMUNITY): Payer: Self-pay

## 2021-09-21 ENCOUNTER — Other Ambulatory Visit (HOSPITAL_COMMUNITY): Payer: Self-pay

## 2021-09-27 ENCOUNTER — Other Ambulatory Visit (HOSPITAL_COMMUNITY): Payer: Self-pay

## 2021-09-27 MED ORDER — FENOFIBRATE 48 MG PO TABS
ORAL_TABLET | ORAL | 3 refills | Status: DC
  Filled 2021-09-27: qty 90, 90d supply, fill #0
  Filled 2021-12-18: qty 90, 90d supply, fill #1
  Filled 2022-03-13: qty 90, 90d supply, fill #2
  Filled 2022-06-19: qty 90, 90d supply, fill #3

## 2021-09-27 MED ORDER — HYDROCODONE-ACETAMINOPHEN 7.5-325 MG PO TABS
ORAL_TABLET | ORAL | 0 refills | Status: DC
  Filled 2021-09-27: qty 120, 30d supply, fill #0

## 2021-09-27 MED ORDER — GLUCOSE BLOOD VI STRP
ORAL_STRIP | 4 refills | Status: DC
  Filled 2021-09-27: qty 100, 90d supply, fill #0

## 2021-09-27 MED ORDER — ACCU-CHEK GUIDE W/DEVICE KIT
PACK | 0 refills | Status: DC
  Filled 2021-09-27: qty 1, 1d supply, fill #0

## 2021-09-27 MED ORDER — ACCU-CHEK SOFTCLIX LANCETS MISC
4 refills | Status: AC
Start: 2021-09-27 — End: ?

## 2021-09-27 MED ORDER — MELOXICAM 15 MG PO TABS
ORAL_TABLET | ORAL | 0 refills | Status: DC
  Filled 2021-09-27: qty 30, 30d supply, fill #0

## 2021-10-04 ENCOUNTER — Encounter (HOSPITAL_COMMUNITY): Payer: Self-pay

## 2021-10-04 ENCOUNTER — Other Ambulatory Visit: Payer: Self-pay

## 2021-10-04 ENCOUNTER — Other Ambulatory Visit (HOSPITAL_COMMUNITY): Payer: Self-pay

## 2021-10-04 ENCOUNTER — Ambulatory Visit (HOSPITAL_COMMUNITY)
Admission: EM | Admit: 2021-10-04 | Discharge: 2021-10-04 | Disposition: A | Discharge status: Home / Self Care | Payer: Medicare Other | Attending: Family Medicine | Admitting: Family Medicine

## 2021-10-04 DIAGNOSIS — R6889 Other general symptoms and signs: Secondary | ICD-10-CM

## 2021-10-04 DIAGNOSIS — Z20828 Contact with and (suspected) exposure to other viral communicable diseases: Secondary | ICD-10-CM | POA: Diagnosis not present

## 2021-10-04 LAB — POC INFLUENZA A AND B ANTIGEN (URGENT CARE ONLY)
INFLUENZA A ANTIGEN, POC: NEGATIVE
INFLUENZA B ANTIGEN, POC: NEGATIVE

## 2021-10-04 MED ORDER — BENZONATATE 100 MG PO CAPS
200.0000 mg | ORAL_CAPSULE | Freq: Three times a day (TID) | ORAL | 0 refills | Status: AC | PRN
  Filled 2021-10-04: qty 40, 7d supply, fill #0

## 2021-10-04 MED ORDER — OSELTAMIVIR PHOSPHATE 75 MG PO CAPS
75.0000 mg | ORAL_CAPSULE | Freq: Two times a day (BID) | ORAL | 0 refills | Status: DC
  Filled 2021-10-04: qty 10, 5d supply, fill #0

## 2021-10-04 NOTE — Discharge Instructions (Signed)
If you develop any worsening of symptoms, or difficulty breathing, so immediately to the emergency department.

## 2021-10-04 NOTE — ED Provider Notes (Signed)
Antonio Huber    CSN: [id number] Arrival date & time: 10/04/21  1529      History   Chief Complaint Chief Complaint  Patient presents with   Cough    HPI Antonio Huber is a 46 y.o. male.   HPI Patient presents cough, sneezing, runny nose and recently exposed to flu.  Patient reports both his wife and 2 children tested positive for influenza after being symptomatic for total of 4 days.  He reports his symptoms started 3 to 4 days ago with a cough, nasal congestion headache.  Today he reports experiencing chills along with generalized body aches and continues to have a cough which is occasionally productive of mucus.  He reports treating symptoms with Mucinex without relief of symptoms.  He denies any chest pain or shortness of breath.     Past Medical History:  Diagnosis Date   Diabetes mellitus without complication ([place])    Type 2   High cholesterol    Hypertension    Musculoskeletal pain    after multiple traumatic events    Patient Active Problem List   Diagnosis Date Noted   Medial knee pain 03/19/2015   Hypertriglyceridemia 03/01/2015   Dyslipidemia associated with type 2 diabetes mellitus ([place]) 03/01/2015   Vitamin D deficiency 02/19/2015   T2DM (type 2 diabetes mellitus) ([place]) 02/19/2015   Shoulder pain 06/11/2012   Chronic back pain 06/07/2012   Frequent urination 06/07/2012   Obesity 06/07/2012   HTN (hypertension) 06/07/2012    Past Surgical History:  Procedure Laterality Date   boil surgery     WISDOM TOOTH EXTRACTION         Home Medications    Prior to Admission medications   Medication Sig Start Date End Date Taking? Authorizing Provider  benzonatate (TESSALON) 100 MG capsule Take 2 capsules (200 mg total) by mouth 3 (three) times daily as needed for cough. 10/04/21  Yes Scot Jun, FNP  oseltamivir (TAMIFLU) 75 MG capsule Take 1 capsule (75 mg total) by mouth 2 (two) times daily. 10/04/21  Yes Scot Jun, FNP   Accu-Chek Softclix Lancets lancets use as directed, check fasting glucose daily 09/27/21     atorvastatin (LIPITOR) 40 MG tablet TAKE 1 TABLET BY MOUTH DAILY 11/14/20 11/14/21  Delford Field, FNP  atorvastatin (LIPITOR) 40 MG tablet Take 1 tablet (40 mg) by mouth once daily 09/19/21     Blood Glucose Monitoring Suppl (ACCU-CHEK GUIDE) w/Device KIT use as directed to check fasting glucose daily 09/27/21     Cholecalciferol (VITAMIN D) 50 MCG (2000 UT) tablet TAKE 1 TABLET BY MOUTH ONCE A DAY AFTER A MEAL 11/14/20 11/14/21  Delford Field, FNP  Continuous Blood Gluc Receiver (FREESTYLE LIBRE 14 DAY READER) DEVI Use as directed, check fasting glucose daily Patient not taking: Reported on 07/05/2021 04/01/21     fenofibrate (TRICOR) 48 MG tablet Take 1 tablet (48 mg) by mouth once daily 09/27/21     fluconazole (DIFLUCAN) 150 MG tablet Take 3 tablets (450 mg total) by mouth once a week for 26 doses. 05/15/21   McDonald, Stephan Minister, DPM  gabapentin (NEURONTIN) 300 MG capsule Take 2 capsules (600 mg total) by mouth 3 (three) times daily. Patient taking differently: Take 300 mg by mouth 3 (three) times daily. 04/12/15   Patrecia Pour, MD  gabapentin (NEURONTIN) 300 MG capsule Take 1 capsule by mouth 3 times a day 09/19/21     glucose blood test strip use as  directed to check fasting glucose daily 09/27/21     HYDROcodone-acetaminophen (NORCO) 7.5-325 MG tablet Take 1 tablet by mouth every 6 hours. 09/27/21     meloxicam (MOBIC) 15 MG tablet Take 1 tablet by mouth once a day 07/30/21     meloxicam (MOBIC) 15 MG tablet Take 1 tablet by mouth once a day. 08/27/21     meloxicam (MOBIC) 15 MG tablet Take 1 tablet by mouth once a day. 09/27/21     meloxicam (MOBIC) 7.5 MG tablet Take 1 tablet (7.5 mg) by mouth once daily 09/19/21     metFORMIN (GLUCOPHAGE) 1000 MG tablet TAKE 1 TABLET BY MOUTH 2 TIMES PER DAY WITH MORNING AND EVENING MEALS 06/21/21 06/21/22    Multiple Vitamin (MULTIVITAMIN WITH MINERALS) TABS tablet Take  1 tablet by mouth daily.    [provider]  Naloxone HCl (KLOXXADO) 8 MG/0.1ML LIQD Use as directed when needed for opioid emergency Patient not taking: No sig reported 04/02/21     omeprazole (PRILOSEC) 40 MG capsule TAKE 1 CAPSULE BY MOUTH ONCE A DAY BEFORE A MEAL 11/14/20 11/14/21  Lily Peer A, FNP  ondansetron (ZOFRAN) 4 MG tablet Take 1 tablet (4 mg total) by mouth every 6 (six) hours. Take 1 tablet by mouth every 6 hours if experiencing nausea or vomiting 08/24/21   Blue, Soijett A, PA-C  Potassium Chloride ER 20 MEQ TBCR Take 1 tablet by mouth once daily with food 04/01/21     triamterene-hydrochlorothiazide (MAXZIDE-25) 37.5-25 MG tablet TAKE 1 TABLET BY MOUTH ONCE A DAY 11/14/20 11/14/21  Delford Field, FNP    Family History Family History  Problem Relation Age of Onset   Carpal tunnel syndrome Sister    Breast cancer Maternal Grandmother    Colon cancer Neg Hx    Esophageal cancer Neg Hx     Social History Social History   Tobacco Use   Smoking status: Every Day    Packs/day: 0.25    Types: Cigarettes   Smokeless tobacco: Never  Vaping Use   Vaping Use: Never used  Substance Use Topics   Alcohol use: No    Alcohol/week: 0.0 standard drinks   Drug use: No     Allergies   Lisinopril and Montelukast   Review of Systems Review of Systems Pertinent negatives listed in HPI  Physical Exam Triage Vital Signs ED Triage Vitals  Enc Vitals Group     BP 10/04/21 1642 (!) 136/102     Pulse Rate 10/04/21 1642 (!) 104     Resp 10/04/21 1642 20     Temp 10/04/21 1642 98.2 F (36.8 C)     Temp Source 10/04/21 1642 Oral     SpO2 10/04/21 1642 95 %     Weight --      Height --      Head Circumference --      Peak Flow --      Pain Score 10/04/21 1641 0     Pain Loc --      Pain Edu? --      Excl. in [place]? --    No data found.  Updated Vital Signs BP (!) 136/102 (BP Location: Left Arm)    Pulse (!) 104    Temp 98.2 F (36.8 C) (Oral)    Resp 20     SpO2 95%   Visual Acuity Right Eye Distance:   Left Eye Distance:   Bilateral Distance:    Right Eye Near:   Left  Eye Near:    Bilateral Near:     Physical Exam Constitutional:      Appearance: He is ill-appearing.  HENT:     Head: Normocephalic and atraumatic.     Nose: Congestion and rhinorrhea present.  Eyes:     Extraocular Movements: Extraocular movements intact.     Pupils: Pupils are equal, round, and reactive to light.  Cardiovascular:     Rate and Rhythm: Regular rhythm. Tachycardia present.  Pulmonary:     Effort: Pulmonary effort is normal.     Breath sounds: Normal breath sounds.  Musculoskeletal:     Cervical back: Normal range of motion and neck supple.  Lymphadenopathy:     Cervical: No cervical adenopathy.  Skin:    General: Skin is warm.     Capillary Refill: Capillary refill takes less than 2 seconds.  Neurological:     General: No focal deficit present.     Mental Status: He is alert and oriented to person, place, and time.  Psychiatric:        Mood and Affect: Mood normal.        Thought Content: Thought content normal.        Judgment: Judgment normal.     UC Treatments / Results  Labs (all labs ordered are listed, but only abnormal results are displayed) Labs Reviewed  POC INFLUENZA A AND B ANTIGEN (URGENT CARE ONLY)    EKG   Radiology No results found.  Procedures Procedures (including critical care time)  Medications Ordered in UC Medications - No data to display  Initial Impression / Assessment and Plan / UC Course  I have reviewed the triage vital signs and the nursing notes.  Pertinent labs & imaging results that were available during my care of the patient were reviewed by me and considered in my medical decision making (see chart for details).    Treating patient for flulike symptoms we will go ahead and initiate treatment with Tamiflu given his wife and children have both tested positive for influenza today.  Their  symptoms have been present for nearly the same amount of time.  Patient's rapid flu did result as negative.  Management of cough with benzonatate.  Advised patient to continue Mucinex.  If he develops any symptoms of difficulty breathing or chest tightness to go immediately to the emergency department Final Clinical Impressions(s) / UC Diagnoses   Final diagnoses:  Flu-like symptoms  Exposure to the flu     Discharge Instructions      If you develop any worsening of symptoms, or difficulty breathing, so immediately to the emergency department.     ED Prescriptions     Medication Sig Dispense Auth. Provider   oseltamivir (TAMIFLU) 75 MG capsule Take 1 capsule (75 mg total) by mouth 2 (two) times daily. 10 capsule Scot Jun, FNP   benzonatate (TESSALON) 100 MG capsule Take 2 capsules (200 mg total) by mouth 3 (three) times daily as needed for cough. 40 capsule Scot Jun, FNP      PDMP not reviewed this encounter.   Scot Jun, FNP 10/04/21 [phone]

## 2021-10-04 NOTE — ED Triage Notes (Signed)
Pt presents with c/o cough, sneezing, runny nose and states he was exposed to FLU recently.

## 2021-10-10 ENCOUNTER — Other Ambulatory Visit (HOSPITAL_COMMUNITY): Payer: Self-pay

## 2021-10-10 MED FILL — Omeprazole Cap Delayed Release 40 MG: ORAL | 90 days supply | Qty: 90 | Fill #3 | Status: AC

## 2021-10-10 MED FILL — Triamterene & Hydrochlorothiazide Tab 37.5-25 MG: ORAL | 90 days supply | Qty: 90 | Fill #3 | Status: AC

## 2021-10-11 ENCOUNTER — Other Ambulatory Visit (HOSPITAL_COMMUNITY): Payer: Self-pay

## 2021-10-11 MED ORDER — METFORMIN HCL 1000 MG PO TABS
ORAL_TABLET | ORAL | 3 refills | Status: DC
  Filled 2021-10-11: qty 180, 90d supply, fill #0
  Filled 2022-01-27: qty 180, 90d supply, fill #1
  Filled 2022-05-21: qty 180, 90d supply, fill #2
  Filled 2022-08-14: qty 180, 90d supply, fill #3

## 2021-10-17 ENCOUNTER — Other Ambulatory Visit (HOSPITAL_COMMUNITY): Payer: Self-pay

## 2021-10-18 ENCOUNTER — Other Ambulatory Visit (HOSPITAL_COMMUNITY): Payer: Self-pay

## 2021-10-18 MED FILL — Fluconazole Tab 150 MG: ORAL | 14 days supply | Qty: 6 | Fill #2 | Status: AC

## 2021-10-24 ENCOUNTER — Other Ambulatory Visit (HOSPITAL_COMMUNITY): Payer: Self-pay

## 2021-10-29 ENCOUNTER — Other Ambulatory Visit (HOSPITAL_COMMUNITY): Payer: Self-pay

## 2021-10-29 MED ORDER — MELOXICAM 15 MG PO TABS
ORAL_TABLET | ORAL | 0 refills | Status: DC
  Filled 2021-10-29: qty 30, 30d supply, fill #0

## 2021-10-29 MED ORDER — HYDROCODONE-ACETAMINOPHEN 7.5-325 MG PO TABS
ORAL_TABLET | ORAL | 0 refills | Status: DC
  Filled 2021-10-29: qty 120, 30d supply, fill #0

## 2021-11-08 ENCOUNTER — Other Ambulatory Visit: Payer: Self-pay | Admitting: Podiatry

## 2021-11-08 ENCOUNTER — Other Ambulatory Visit (HOSPITAL_COMMUNITY): Payer: Self-pay

## 2021-11-09 ENCOUNTER — Other Ambulatory Visit (HOSPITAL_COMMUNITY): Payer: Self-pay

## 2021-11-11 ENCOUNTER — Other Ambulatory Visit (HOSPITAL_COMMUNITY): Payer: Self-pay

## 2021-11-12 ENCOUNTER — Other Ambulatory Visit (HOSPITAL_COMMUNITY): Payer: Self-pay

## 2021-11-12 MED ORDER — FLUCONAZOLE 150 MG PO TABS
ORAL_TABLET | ORAL | 0 refills | Status: DC
Start: 2021-11-12 — End: 2024-04-06
  Filled 2021-11-12: qty 36, 84d supply, fill #0
  Filled 2022-01-24: qty 36, 84d supply, fill #1
  Filled 2022-04-10: qty 6, 14d supply, fill #2

## 2021-11-13 ENCOUNTER — Other Ambulatory Visit (HOSPITAL_COMMUNITY): Payer: Self-pay

## 2021-11-26 ENCOUNTER — Other Ambulatory Visit (HOSPITAL_COMMUNITY): Payer: Self-pay

## 2021-11-26 MED ORDER — MELOXICAM 15 MG PO TABS
ORAL_TABLET | ORAL | 0 refills | Status: DC
  Filled 2021-11-26: qty 30, 30d supply, fill #0

## 2021-11-26 MED ORDER — HYDROCODONE-ACETAMINOPHEN 7.5-325 MG PO TABS
ORAL_TABLET | ORAL | 0 refills | Status: DC
  Filled 2021-11-26: qty 120, 30d supply, fill #0

## 2021-12-03 ENCOUNTER — Other Ambulatory Visit (HOSPITAL_COMMUNITY): Payer: Self-pay

## 2021-12-18 ENCOUNTER — Other Ambulatory Visit (HOSPITAL_COMMUNITY): Payer: Self-pay

## 2021-12-24 ENCOUNTER — Other Ambulatory Visit (HOSPITAL_COMMUNITY): Payer: Self-pay

## 2021-12-24 MED ORDER — MELOXICAM 15 MG PO TABS
ORAL_TABLET | ORAL | 0 refills | Status: DC
  Filled 2021-12-24: qty 30, 30d supply, fill #0

## 2021-12-24 MED ORDER — HYDROCODONE-ACETAMINOPHEN 7.5-325 MG PO TABS
ORAL_TABLET | ORAL | 0 refills | Status: DC
  Filled 2021-12-24: qty 120, 30d supply, fill #0

## 2022-01-10 ENCOUNTER — Other Ambulatory Visit (HOSPITAL_COMMUNITY): Payer: Self-pay

## 2022-01-10 ENCOUNTER — Other Ambulatory Visit: Payer: Self-pay

## 2022-01-15 ENCOUNTER — Other Ambulatory Visit (HOSPITAL_COMMUNITY): Payer: Self-pay

## 2022-01-15 MED ORDER — OMEPRAZOLE 40 MG PO CPDR
40.0000 mg | DELAYED_RELEASE_CAPSULE | Freq: Every day | ORAL | 3 refills | Status: DC
  Filled 2022-01-15: qty 90, 90d supply, fill #0
  Filled 2022-04-10: qty 90, 90d supply, fill #1
  Filled 2022-07-09: qty 90, 90d supply, fill #2

## 2022-01-15 MED ORDER — CEPHALEXIN 500 MG PO CAPS
500.0000 mg | ORAL_CAPSULE | Freq: Four times a day (QID) | ORAL | 0 refills | Status: DC
  Filled 2022-01-15: qty 40, 10d supply, fill #0

## 2022-01-15 MED ORDER — POTASSIUM CHLORIDE ER 20 MEQ PO TBCR
1.0000 | EXTENDED_RELEASE_TABLET | Freq: Every day | ORAL | 3 refills | Status: DC
  Filled 2022-01-15 – 2022-02-21 (×4): qty 90, 90d supply, fill #0
  Filled 2022-05-17: qty 90, 90d supply, fill #1
  Filled 2022-08-06: qty 90, 90d supply, fill #2
  Filled 2022-11-05: qty 90, 90d supply, fill #3

## 2022-01-15 MED ORDER — VITAMIN D3 50 MCG (2000 UT) PO TABS: 1.0000 | ORAL_TABLET | Freq: Every day | ORAL | 3 refills | Status: AC

## 2022-01-15 MED ORDER — CEPHALEXIN 500 MG PO CAPS
ORAL_CAPSULE | ORAL | 0 refills | Status: DC
  Filled 2022-01-15: qty 40, 10d supply, fill #0

## 2022-01-15 MED ORDER — TRIAMTERENE-HCTZ 37.5-25 MG PO TABS
1.0000 | ORAL_TABLET | Freq: Every day | ORAL | 3 refills | Status: DC
  Filled 2022-01-15: qty 90, 90d supply, fill #0
  Filled 2022-04-15: qty 90, 90d supply, fill #1

## 2022-01-23 ENCOUNTER — Other Ambulatory Visit (HOSPITAL_COMMUNITY): Payer: Self-pay

## 2022-01-23 MED ORDER — MELOXICAM 15 MG PO TABS
ORAL_TABLET | ORAL | 0 refills | Status: DC
  Filled 2022-01-23: qty 30, 30d supply, fill #0

## 2022-01-23 MED ORDER — HYDROCODONE-ACETAMINOPHEN 7.5-325 MG PO TABS
ORAL_TABLET | ORAL | 0 refills | Status: DC
  Filled 2022-01-23: qty 120, 30d supply, fill #0

## 2022-01-24 ENCOUNTER — Other Ambulatory Visit (HOSPITAL_COMMUNITY): Payer: Self-pay

## 2022-01-27 ENCOUNTER — Other Ambulatory Visit (HOSPITAL_COMMUNITY): Payer: Self-pay

## 2022-02-10 ENCOUNTER — Other Ambulatory Visit (HOSPITAL_COMMUNITY): Payer: Self-pay

## 2022-02-10 ENCOUNTER — Ambulatory Visit: Payer: Medicare Other | Admitting: Podiatry

## 2022-02-15 ENCOUNTER — Ambulatory Visit (INDEPENDENT_AMBULATORY_CARE_PROVIDER_SITE_OTHER): Payer: Medicare Other | Admitting: Podiatry

## 2022-02-15 ENCOUNTER — Other Ambulatory Visit (HOSPITAL_COMMUNITY): Payer: Self-pay

## 2022-02-15 DIAGNOSIS — L600 Ingrowing nail: Secondary | ICD-10-CM | POA: Diagnosis not present

## 2022-02-15 DIAGNOSIS — L03031 Cellulitis of right toe: Secondary | ICD-10-CM

## 2022-02-15 MED ORDER — CEPHALEXIN 500 MG PO CAPS: 500.0000 mg | ORAL_CAPSULE | Freq: Three times a day (TID) | ORAL | 0 refills | Status: DC

## 2022-02-15 MED ORDER — HYDROCODONE-ACETAMINOPHEN 5-325 MG PO TABS: 1.0000 | ORAL_TABLET | Freq: Four times a day (QID) | ORAL | 0 refills | Status: DC | PRN

## 2022-02-15 MED ORDER — CEPHALEXIN 500 MG PO CAPS
500.0000 mg | ORAL_CAPSULE | Freq: Three times a day (TID) | ORAL | 0 refills | Status: AC
  Filled 2022-02-15: qty 15, 5d supply, fill #0

## 2022-02-15 MED ORDER — CEPHALEXIN 500 MG PO CAPS
500.0000 mg | ORAL_CAPSULE | Freq: Three times a day (TID) | ORAL | 0 refills | Status: DC
  Filled 2022-02-15: qty 30, 10d supply, fill #0

## 2022-02-15 MED ORDER — CEPHALEXIN 500 MG PO CAPS
500.0000 mg | ORAL_CAPSULE | Freq: Three times a day (TID) | ORAL | 0 refills | Status: DC
Start: 2022-02-15 — End: 2022-02-15

## 2022-02-15 MED ORDER — HYDROCODONE-ACETAMINOPHEN 5-325 MG PO TABS
1.0000 | ORAL_TABLET | Freq: Four times a day (QID) | ORAL | 0 refills | Status: AC | PRN
  Filled 2022-02-15 – 2022-02-20 (×2): qty 12, 3d supply, fill #0

## 2022-02-15 NOTE — Patient Instructions (Signed)
Soak Instructions    THE DAY AFTER THE PROCEDURE  Place 1/4 cup of epsom salts (or betadine, or white vinegar) in a quart of warm tap water.  Submerge your foot or feet with outer bandage intact for the initial soak; this will allow the bandage to become moist and wet for easy lift off.  Once you remove your bandage, continue to soak in the solution for 20 minutes.  This soak should be done twice a day.  Next, remove your foot or feet from solution, blot dry the affected area and cover.  You may use a band aid large enough to cover the area or use gauze and tape.  Apply other medications to the area as directed by the doctor such as polysporin neosporin.  IF YOUR SKIN BECOMES IRRITATED WHILE USING THESE INSTRUCTIONS, IT IS OKAY TO SWITCH TO  WHITE VINEGAR AND WATER. Or you may use antibacterial soap and water to keep the toe clean  Monitor for any signs/symptoms of infection. Call the office immediately if any occur or go directly to the emergency room. Call with any questions/concerns.   Do this for 2 weeks then leave open to air

## 2022-02-18 NOTE — Progress Notes (Signed)
  Subjective:  Patient ID: Antonio Huber, male    DOB: November 17, 1975,  MRN: [id number]  Chief Complaint  Patient presents with   Nail Problem    Right hallux nail  Pain and red and swollen  Has been soaking it in epsom salt has been on keflex     46 y.o. male presents with the above complaint. History confirmed with patient.    Objective:  Physical Exam: warm, good capillary refill, no trophic changes or ulcerative lesions, normal DP and PT pulses, and normal sensory exam.  Right Foot: Right hallux ingrown nail with slight paronychia     Assessment:   1. Ingrowing right great toenail   2. Paronychia of great toe of right foot       Plan:  Patient was evaluated and treated and all questions answered.    Ingrown Nail, right -Patient elects to proceed with minor surgery to remove ingrown toenail today. Consent reviewed and signed by patient. -Ingrown nail excised. See procedure note. -Educated on post-procedure care including soaking. Written instructions provided and reviewed. -Rx for cephalexin postprocedural pain medication sent to pharmacy, reviewed its use and possible side effects.   Procedure: Excision of Ingrown Toenail Location: Right 1st toe  medial and lateral  nail borders. Anesthesia: Lidocaine 1% plain; 1.5 mL and Marcaine 0.5% plain; 1.5 mL, digital block. Skin Prep: Betadine. Dressing: Silvadene; telfa; dry, sterile, compression dressing. Technique: Following skin prep, the toe was exsanguinated and a tourniquet was secured at the base of the toe. The affected nail border was freed, split with a nail splitter, and excised. Chemical matrixectomy was then performed with phenol and irrigated out with alcohol. The tourniquet was then removed and sterile dressing applied. Disposition: Patient tolerated procedure well. Lanae Crumbly, DPM 02/18/2022    Return if symptoms worsen or fail to improve.

## 2022-02-20 ENCOUNTER — Other Ambulatory Visit (HOSPITAL_COMMUNITY): Payer: Self-pay

## 2022-02-21 ENCOUNTER — Other Ambulatory Visit (HOSPITAL_COMMUNITY): Payer: Self-pay

## 2022-02-21 MED ORDER — HYDROCODONE-ACETAMINOPHEN 7.5-325 MG PO TABS
ORAL_TABLET | ORAL | 0 refills | Status: DC
  Filled 2022-02-21: qty 120, 30d supply, fill #0

## 2022-02-21 MED ORDER — MELOXICAM 15 MG PO TABS
ORAL_TABLET | ORAL | 0 refills | Status: DC
Start: 2022-02-21 — End: 2024-04-06

## 2022-03-07 ENCOUNTER — Other Ambulatory Visit (HOSPITAL_COMMUNITY): Payer: Self-pay

## 2022-03-07 MED ORDER — CYCLOBENZAPRINE HCL 10 MG PO TABS
ORAL_TABLET | ORAL | 1 refills | Status: DC
  Filled 2022-03-07: qty 60, 30d supply, fill #0
  Filled 2022-04-04: qty 60, 30d supply, fill #1

## 2022-03-13 ENCOUNTER — Other Ambulatory Visit (HOSPITAL_COMMUNITY): Payer: Self-pay

## 2022-03-21 ENCOUNTER — Other Ambulatory Visit (HOSPITAL_COMMUNITY): Payer: Self-pay

## 2022-03-21 MED ORDER — MELOXICAM 15 MG PO TABS
ORAL_TABLET | ORAL | 0 refills | Status: DC
Start: 2022-03-21 — End: 2024-04-06
  Filled 2022-03-21: qty 30, 30d supply, fill #0

## 2022-03-21 MED ORDER — HYDROCODONE-ACETAMINOPHEN 7.5-325 MG PO TABS
1.0000 | ORAL_TABLET | Freq: Four times a day (QID) | ORAL | 0 refills | Status: DC
  Filled 2022-08-12: qty 120, 30d supply, fill #0

## 2022-03-21 MED ORDER — HYDROCODONE-ACETAMINOPHEN 7.5-325 MG PO TABS
ORAL_TABLET | ORAL | 0 refills | Status: DC
  Filled 2022-03-21: qty 120, 30d supply, fill #0

## 2022-03-31 ENCOUNTER — Other Ambulatory Visit (HOSPITAL_COMMUNITY): Payer: Self-pay

## 2022-04-04 ENCOUNTER — Other Ambulatory Visit (HOSPITAL_COMMUNITY): Payer: Self-pay

## 2022-04-10 ENCOUNTER — Other Ambulatory Visit (HOSPITAL_COMMUNITY): Payer: Self-pay

## 2022-04-15 ENCOUNTER — Other Ambulatory Visit (HOSPITAL_COMMUNITY): Payer: Self-pay

## 2022-04-18 ENCOUNTER — Other Ambulatory Visit (HOSPITAL_COMMUNITY): Payer: Self-pay

## 2022-04-18 MED ORDER — MELOXICAM 15 MG PO TABS
ORAL_TABLET | ORAL | 0 refills | Status: DC
  Filled 2022-04-18: qty 30, 30d supply, fill #0

## 2022-04-18 MED ORDER — HYDROCODONE-ACETAMINOPHEN 7.5-325 MG PO TABS
1.0000 | ORAL_TABLET | Freq: Four times a day (QID) | ORAL | 0 refills | Status: DC
  Filled 2022-04-19 – 2022-04-21 (×2): qty 120, 30d supply, fill #0

## 2022-04-19 ENCOUNTER — Other Ambulatory Visit (HOSPITAL_COMMUNITY): Payer: Self-pay

## 2022-04-21 ENCOUNTER — Other Ambulatory Visit (HOSPITAL_COMMUNITY): Payer: Self-pay

## 2022-04-24 ENCOUNTER — Other Ambulatory Visit (HOSPITAL_COMMUNITY): Payer: Self-pay

## 2022-04-28 ENCOUNTER — Other Ambulatory Visit (HOSPITAL_COMMUNITY): Payer: Self-pay

## 2022-05-01 ENCOUNTER — Other Ambulatory Visit (HOSPITAL_COMMUNITY): Payer: Self-pay

## 2022-05-07 ENCOUNTER — Other Ambulatory Visit (HOSPITAL_COMMUNITY): Payer: Self-pay

## 2022-05-13 ENCOUNTER — Other Ambulatory Visit (HOSPITAL_COMMUNITY): Payer: Self-pay

## 2022-05-13 MED ORDER — LOSARTAN POTASSIUM-HCTZ 50-12.5 MG PO TABS
1.0000 | ORAL_TABLET | Freq: Every day | ORAL | 1 refills | Status: DC
  Filled 2022-05-13: qty 30, 30d supply, fill #0
  Filled 2022-06-07: qty 30, 30d supply, fill #1

## 2022-05-17 ENCOUNTER — Other Ambulatory Visit (HOSPITAL_COMMUNITY): Payer: Self-pay

## 2022-05-20 ENCOUNTER — Other Ambulatory Visit (HOSPITAL_COMMUNITY): Payer: Self-pay

## 2022-05-20 MED ORDER — HYDROCODONE-ACETAMINOPHEN 7.5-325 MG PO TABS
1.0000 | ORAL_TABLET | Freq: Four times a day (QID) | ORAL | 0 refills | Status: DC
  Filled 2022-05-20: qty 120, 30d supply, fill #0

## 2022-05-20 MED ORDER — MELOXICAM 15 MG PO TABS
15.0000 mg | ORAL_TABLET | Freq: Every day | ORAL | 0 refills | Status: DC
Start: 2022-05-20 — End: 2022-05-20
  Filled 2022-05-20: qty 30, 30d supply, fill #0

## 2022-05-21 ENCOUNTER — Other Ambulatory Visit (HOSPITAL_COMMUNITY): Payer: Self-pay

## 2022-05-23 ENCOUNTER — Other Ambulatory Visit (HOSPITAL_COMMUNITY): Payer: Self-pay

## 2022-05-23 MED ORDER — CYCLOBENZAPRINE HCL 10 MG PO TABS
ORAL_TABLET | ORAL | 1 refills | Status: DC
  Filled 2022-05-23: qty 60, 30d supply, fill #0
  Filled 2022-06-18: qty 60, 30d supply, fill #1

## 2022-05-24 ENCOUNTER — Other Ambulatory Visit (HOSPITAL_COMMUNITY): Payer: Self-pay

## 2022-06-07 ENCOUNTER — Other Ambulatory Visit (HOSPITAL_COMMUNITY): Payer: Self-pay

## 2022-06-11 ENCOUNTER — Encounter (HOSPITAL_COMMUNITY): Payer: Self-pay

## 2022-06-11 ENCOUNTER — Ambulatory Visit (HOSPITAL_COMMUNITY)
Admission: EM | Admit: 2022-06-11 | Discharge: 2022-06-11 | Disposition: A | Discharge status: Home / Self Care | Payer: Medicare Other | Attending: Emergency Medicine | Admitting: Emergency Medicine

## 2022-06-11 DIAGNOSIS — Z202 Contact with and (suspected) exposure to infections with a predominantly sexual mode of transmission: Secondary | ICD-10-CM

## 2022-06-11 DIAGNOSIS — Z113 Encounter for screening for infections with a predominantly sexual mode of transmission: Secondary | ICD-10-CM | POA: Diagnosis present

## 2022-06-11 LAB — HIV ANTIBODY (ROUTINE TESTING W REFLEX): HIV Screen 4th Generation wRfx: NONREACTIVE

## 2022-06-11 NOTE — ED Triage Notes (Signed)
Pt is here for STD testing pt do not have any symptoms . Pt states his wife was diagnosed with UTI so pt is concerned and wanted STD testing.

## 2022-06-11 NOTE — Discharge Instructions (Signed)
Today we are completing routine testing for sexually transmitted infections, UTI is not a as STD and you cannot pass it back and forth, as you have no urinary symptoms today we do not need to test your urine for infection  STI Labs pending 2-3 days, you will be contacted if positive for any sti and treatment will be sent to the pharmacy, you will have to return to the clinic if positive for gonorrhea to receive treatment   Please refrain from having sex until labs results, if positive please refrain from having sex until treatment complete and symptoms resolve   If positive for HIV, Syphilis, Chlamydia  gonorrhea or trichomoniasis please notify partner or partners so they may tested as well  Moving forward, it is recommended you use some form of protection against the transmission of sti infections  such as condoms or dental dams with each sexual encounter

## 2022-06-11 NOTE — ED Provider Notes (Signed)
[facility]    CSN: [id number] Arrival date & time: 06/11/22  1536      History   Chief Complaint Chief Complaint  Patient presents with   Exposure to STD    HPI Antonio Huber is a 46 y.o. male.   Patient presents requesting STI testing.  Endorses that his wife tested positive for urinary tract infection recently.  Denies all symptoms.  Sexually active, 1 partner, no condom use.  Past Medical History:  Diagnosis Date   Diabetes mellitus without complication ([place])    Type 2   High cholesterol    Hypertension    Musculoskeletal pain    after multiple traumatic events    Patient Active Problem List   Diagnosis Date Noted   Medial knee pain 03/19/2015   Hypertriglyceridemia 03/01/2015   Dyslipidemia associated with type 2 diabetes mellitus ([place]) 03/01/2015   Vitamin D deficiency 02/19/2015   T2DM (type 2 diabetes mellitus) ([place]) 02/19/2015   Shoulder pain 06/11/2012   Chronic back pain 06/07/2012   Frequent urination 06/07/2012   Obesity 06/07/2012   HTN (hypertension) 06/07/2012    Past Surgical History:  Procedure Laterality Date   boil surgery     WISDOM TOOTH EXTRACTION         Home Medications    Prior to Admission medications   Medication Sig Start Date End Date Taking? Authorizing Provider  Accu-Chek Softclix Lancets lancets use as directed, check fasting glucose daily 09/27/21     atorvastatin (LIPITOR) 40 MG tablet TAKE 1 TABLET BY MOUTH DAILY 11/14/20 11/14/21  Delford Field, FNP  atorvastatin (LIPITOR) 40 MG tablet Take 1 tablet by mouth once daily 09/19/21     benzonatate (TESSALON) 100 MG capsule Take 2 capsules (200 mg total) by mouth 3 (three) times daily as needed for cough. 10/04/21   Scot Jun, FNP  Blood Glucose Monitoring Suppl (ACCU-CHEK GUIDE) w/Device KIT use as directed to check fasting glucose daily 09/27/21     Cholecalciferol (VITAMIN D3) 50 MCG (2000 UT) TABS Take 1 tablet by mouth daily after a meal 01/15/22      Continuous Blood Gluc Receiver (FREESTYLE LIBRE 14 DAY READER) DEVI Use as directed, check fasting glucose daily Patient not taking: Reported on 07/05/2021 04/01/21     cyclobenzaprine (FLEXERIL) 10 MG tablet Take 1 Tablet by mouth 2 times daily as needed for muscle spasms 05/23/22     fenofibrate (TRICOR) 48 MG tablet Take 1 tablet (48 mg) by mouth once daily 09/27/21     fluconazole (DIFLUCAN) 150 MG tablet Take 3 tablets (450 mg total) by mouth once a week for 26 doses. 11/12/21   McDonald, Stephan Minister, DPM  gabapentin (NEURONTIN) 300 MG capsule Take 2 capsules (600 mg total) by mouth 3 (three) times daily. Patient taking differently: Take 300 mg by mouth 3 (three) times daily. 04/12/15   Patrecia Pour, MD  gabapentin (NEURONTIN) 300 MG capsule Take 1 capsule by mouth 3 times a day 09/19/21     glucose blood test strip use as directed to check fasting glucose daily 09/27/21     HYDROcodone-acetaminophen (NORCO) 7.5-325 MG tablet Take 1 tablet by mouth 4 times daily as needed for pain 03/21/22     HYDROcodone-acetaminophen (NORCO) 7.5-325 MG tablet Take 1 tablet by mouth every 6 hours 03/21/22     HYDROcodone-acetaminophen (NORCO) 7.5-325 MG tablet Take 1 tablet by mouth every 6 (six) hours. 04/18/22     HYDROcodone-acetaminophen (NORCO) 7.5-325 MG tablet  Take 1 tablet by mouth every 6 (six) hours. 05/20/22     losartan-hydrochlorothiazide (HYZAAR) 50-12.5 MG tablet Take 1 Tablet by mouth once daily 05/13/22     meloxicam (MOBIC) 15 MG tablet Take 1 tablet by mouth once a day 07/30/21     meloxicam (MOBIC) 15 MG tablet Take 1 tablet by mouth once a day. 08/27/21     meloxicam (MOBIC) 15 MG tablet Take 1 tablet by mouth once a day. 09/27/21     meloxicam (MOBIC) 15 MG tablet Take 1 tablet by mouth once daily 10/29/21     meloxicam (MOBIC) 15 MG tablet Take 1 tablet by mouth once a day. 11/26/21     meloxicam (MOBIC) 15 MG tablet Take 1 tablet by mouth daily 12/24/21     meloxicam (MOBIC) 15 MG tablet Take 1 tablet  by mouth once a day. 01/23/22     meloxicam (MOBIC) 15 MG tablet Take 1 tablet by mouth once a day. 02/21/22     meloxicam (MOBIC) 15 MG tablet Take 1 tablet by mouth once a day. 03/21/22     meloxicam (MOBIC) 15 MG tablet Take 1 tablets by mouth daily after a meal 04/18/22     meloxicam (MOBIC) 7.5 MG tablet Take 1 tablet (7.5 mg) by mouth once daily 09/19/21     metFORMIN (GLUCOPHAGE) 1000 MG tablet TAKE 1 TABLET BY MOUTH 2 TIMES PER DAY WITH MORNING AND EVENING MEALS 06/21/21 06/21/22    metFORMIN (GLUCOPHAGE) 1000 MG tablet Take 1 tablet (1,000 mg) by mouth 2 times per day with morning and evening meals 10/11/21     Multiple Vitamin (MULTIVITAMIN WITH MINERALS) TABS tablet Take 1 tablet by mouth daily.    [provider]  Naloxone HCl (KLOXXADO) 8 MG/0.1ML LIQD Use as directed when needed for opioid emergency Patient not taking: No sig reported 04/02/21     omeprazole (PRILOSEC) 40 MG capsule TAKE 1 CAPSULE BY MOUTH ONCE A DAY BEFORE A MEAL 11/14/20 11/14/21  Delford Field, FNP  omeprazole (PRILOSEC) 40 MG capsule Take 1 capsule (40 mg total) by mouth daily before a meal 01/15/22     ondansetron (ZOFRAN) 4 MG tablet Take 1 tablet (4 mg total) by mouth every 6 (six) hours. Take 1 tablet by mouth every 6 hours if experiencing nausea or vomiting 08/24/21   Blue, Soijett A, PA-C  oseltamivir (TAMIFLU) 75 MG capsule Take 1 capsule (75 mg total) by mouth 2 (two) times daily. 10/04/21   Scot Jun, FNP  Potassium Chloride ER 20 MEQ TBCR Take 1 tablet by mouth daily with food. 01/15/22     triamterene-hydrochlorothiazide (MAXZIDE-25) 37.5-25 MG tablet TAKE 1 TABLET BY MOUTH ONCE A DAY 11/14/20 11/14/21  Lily Peer A, FNP  triamterene-hydrochlorothiazide (MAXZIDE-25) 37.5-25 MG tablet Take 1 tablet by mouth daily. 01/15/22       Family History Family History  Problem Relation Age of Onset   Carpal tunnel syndrome Sister    Breast cancer Maternal Grandmother    Colon cancer Neg Hx     Esophageal cancer Neg Hx     Social History Social History   Tobacco Use   Smoking status: Every Day    Packs/day: 0.25    Types: Cigarettes   Smokeless tobacco: Never  Vaping Use   Vaping Use: Never used  Substance Use Topics   Alcohol use: No    Alcohol/week: 0.0 standard drinks of alcohol   Drug use: No     Allergies  Lisinopril and Montelukast   Review of Systems Review of Systems  Constitutional: Negative.   Respiratory: Negative.    Cardiovascular: Negative.   Genitourinary: Negative.   Skin: Negative.      Physical Exam Triage Vital Signs ED Triage Vitals [06/11/22 1658]  Enc Vitals Group     BP 131/86     Pulse Rate 86     Resp 12     Temp 98.4 F (36.9 C)     Temp Source Oral     SpO2 98 %     Weight      Height      Head Circumference      Peak Flow      Pain Score      Pain Loc      Pain Edu?      Excl. in [place]?    No data found.  Updated Vital Signs BP 131/86 (BP Location: Left Arm)   Pulse 86   Temp 98.4 F (36.9 C) (Oral)   Resp 12   SpO2 98%   Visual Acuity Right Eye Distance:   Left Eye Distance:   Bilateral Distance:    Right Eye Near:   Left Eye Near:    Bilateral Near:     Physical Exam Constitutional:      Appearance: Normal appearance.  Eyes:     Extraocular Movements: Extraocular movements intact.  Pulmonary:     Effort: Pulmonary effort is normal.  Genitourinary:    Penis: Normal.      Testes: Normal.  Neurological:     Mental Status: He is alert and oriented to person, place, and time. Mental status is at baseline.  Psychiatric:        Mood and Affect: Mood normal.        Behavior: Behavior normal.      UC Treatments / Results  Labs (all labs ordered are listed, but only abnormal results are displayed) Labs Reviewed - No data to display  EKG   Radiology No results found.  Procedures Procedures (including critical care time)  Medications Ordered in UC Medications - No data to  display  Initial Impression / Assessment and Plan / UC Course  I have reviewed the triage vital signs and the nursing notes.  Pertinent labs & imaging results that were available during my care of the patient were reviewed by me and considered in my medical decision making (see chart for details).  Routine screening for STI  Discussed with patient etiology of urinary tract infections versus sexually transmitted infections, no abnormalities to the penis or testes, no drainage noted on exam, cytology and blood work pending, will treat per protocol, advised abstinence until lab results, treatment is complete and all symptoms have resolved, may follow-up with urgent care as needed Final Clinical Impressions(s) / UC Diagnoses   Final diagnoses:  None   Discharge Instructions   None    ED Prescriptions   None    PDMP not reviewed this encounter.   Hans Eden, NP 06/11/22 1728

## 2022-06-12 ENCOUNTER — Other Ambulatory Visit (HOSPITAL_COMMUNITY): Payer: Self-pay

## 2022-06-12 LAB — RPR: RPR Ser Ql: NONREACTIVE

## 2022-06-12 LAB — CYTOLOGY, (ORAL, ANAL, URETHRAL) ANCILLARY ONLY
Chlamydia: NEGATIVE
Comment: NEGATIVE
Comment: NEGATIVE
Comment: NORMAL
Neisseria Gonorrhea: NEGATIVE
Trichomonas: NEGATIVE

## 2022-06-12 MED ORDER — HYDROCODONE-ACETAMINOPHEN 7.5-325 MG PO TABS
1.0000 | ORAL_TABLET | Freq: Four times a day (QID) | ORAL | 0 refills | Status: DC
  Filled 2022-06-12: qty 120, 30d supply, fill #0

## 2022-06-12 MED ORDER — MELOXICAM 15 MG PO TABS
15.0000 mg | ORAL_TABLET | Freq: Every day | ORAL | 0 refills | Status: DC
  Filled 2022-06-12: qty 30, 30d supply, fill #0

## 2022-06-18 ENCOUNTER — Other Ambulatory Visit (HOSPITAL_COMMUNITY): Payer: Self-pay

## 2022-06-19 ENCOUNTER — Other Ambulatory Visit (HOSPITAL_COMMUNITY): Payer: Self-pay

## 2022-06-24 ENCOUNTER — Other Ambulatory Visit (HOSPITAL_COMMUNITY): Payer: Self-pay

## 2022-06-24 MED ORDER — BLOOD GLUCOSE MONITOR SYSTEM W/DEVICE KIT: PACK | 0 refills | Status: AC

## 2022-06-24 MED ORDER — FREESTYLE TEST VI STRP
ORAL_STRIP | 4 refills | Status: AC
  Filled 2022-06-24: qty 100, 90d supply, fill #0

## 2022-07-01 ENCOUNTER — Other Ambulatory Visit (HOSPITAL_COMMUNITY): Payer: Self-pay

## 2022-07-04 ENCOUNTER — Other Ambulatory Visit (HOSPITAL_COMMUNITY): Payer: Self-pay

## 2022-07-07 ENCOUNTER — Other Ambulatory Visit (HOSPITAL_COMMUNITY): Payer: Self-pay

## 2022-07-07 MED ORDER — LOSARTAN POTASSIUM-HCTZ 50-12.5 MG PO TABS
1.0000 | ORAL_TABLET | Freq: Every day | ORAL | 1 refills | Status: DC
  Filled 2022-07-07: qty 30, 30d supply, fill #0
  Filled 2022-08-06: qty 30, 30d supply, fill #1

## 2022-07-09 ENCOUNTER — Other Ambulatory Visit (HOSPITAL_COMMUNITY): Payer: Self-pay

## 2022-07-14 ENCOUNTER — Other Ambulatory Visit (HOSPITAL_COMMUNITY): Payer: Self-pay

## 2022-07-14 MED ORDER — HYDROCODONE-ACETAMINOPHEN 7.5-325 MG PO TABS
1.0000 | ORAL_TABLET | Freq: Four times a day (QID) | ORAL | 0 refills | Status: DC
  Filled 2022-07-14: qty 120, 30d supply, fill #0

## 2022-07-15 ENCOUNTER — Other Ambulatory Visit (HOSPITAL_COMMUNITY): Payer: Self-pay

## 2022-07-23 ENCOUNTER — Ambulatory Visit (HOSPITAL_COMMUNITY)
Admission: EM | Admit: 2022-07-23 | Discharge: 2022-07-23 | Disposition: A | Discharge status: Home / Self Care | Payer: Medicare Other | Attending: Emergency Medicine | Admitting: Emergency Medicine

## 2022-07-23 ENCOUNTER — Encounter (HOSPITAL_COMMUNITY): Payer: Self-pay

## 2022-07-23 DIAGNOSIS — Z113 Encounter for screening for infections with a predominantly sexual mode of transmission: Secondary | ICD-10-CM | POA: Insufficient documentation

## 2022-07-23 DIAGNOSIS — Z202 Contact with and (suspected) exposure to infections with a predominantly sexual mode of transmission: Secondary | ICD-10-CM | POA: Diagnosis not present

## 2022-07-23 LAB — HIV ANTIBODY (ROUTINE TESTING W REFLEX): HIV Screen 4th Generation wRfx: NONREACTIVE

## 2022-07-23 NOTE — ED Provider Notes (Signed)
[facility]    CSN: [id number] Arrival date & time: 07/23/22  1817      History   Chief Complaint Chief Complaint  Patient presents with   SEXUALLY TRANSMITTED DISEASE    HPI Antonio Huber is a 46 y.o. male.   Patient presents requesting routine STI blood work including HSV as wife has tested positive this morning.  Has been in a monogamous relationship for 20 years, endorses that his wife prior partner had herpes, found out during his recent passing.  Denies all current symptoms.  Past Medical History:  Diagnosis Date   Diabetes mellitus without complication ([place])    Type 2   High cholesterol    Hypertension    Musculoskeletal pain    after multiple traumatic events    Patient Active Problem List   Diagnosis Date Noted   Medial knee pain 03/19/2015   Hypertriglyceridemia 03/01/2015   Dyslipidemia associated with type 2 diabetes mellitus ([place]) 03/01/2015   Vitamin D deficiency 02/19/2015   T2DM (type 2 diabetes mellitus) ([place]) 02/19/2015   Shoulder pain 06/11/2012   Chronic back pain 06/07/2012   Frequent urination 06/07/2012   Obesity 06/07/2012   HTN (hypertension) 06/07/2012    Past Surgical History:  Procedure Laterality Date   boil surgery     WISDOM TOOTH EXTRACTION         Home Medications    Prior to Admission medications   Medication Sig Start Date End Date Taking? Authorizing Provider  Accu-Chek Softclix Lancets lancets use as directed, check fasting glucose daily 09/27/21     atorvastatin (LIPITOR) 40 MG tablet TAKE 1 TABLET BY MOUTH DAILY 11/14/20 11/14/21  Delford Field, FNP  atorvastatin (LIPITOR) 40 MG tablet Take 1 tablet by mouth once daily 09/19/21     benzonatate (TESSALON) 100 MG capsule Take 2 capsules (200 mg total) by mouth 3 (three) times daily as needed for cough. 10/04/21   Scot Jun, FNP  Blood Glucose Monitoring Suppl (ACCU-CHEK GUIDE) w/Device KIT use as directed to check fasting glucose daily 09/27/21      Blood Glucose Monitoring Suppl (BLOOD GLUCOSE MONITOR SYSTEM) w/Device KIT Use as needed for blood glucose monitoring 06/24/22     Cholecalciferol (VITAMIN D3) 50 MCG (2000 UT) TABS Take 1 tablet by mouth daily after a meal 01/15/22     Continuous Blood Gluc Receiver (FREESTYLE LIBRE 14 DAY READER) DEVI Use as directed, check fasting glucose daily Patient not taking: Reported on 07/05/2021 04/01/21     cyclobenzaprine (FLEXERIL) 10 MG tablet Take 1 Tablet by mouth 2 times daily as needed for muscle spasms 05/23/22     fenofibrate (TRICOR) 48 MG tablet Take 1 tablet (48 mg) by mouth once daily 09/27/21     fluconazole (DIFLUCAN) 150 MG tablet Take 3 tablets (450 mg total) by mouth once a week for 26 doses. 11/12/21   McDonald, Stephan Minister, DPM  gabapentin (NEURONTIN) 300 MG capsule Take 2 capsules (600 mg total) by mouth 3 (three) times daily. Patient taking differently: Take 300 mg by mouth 3 (three) times daily. 04/12/15   Patrecia Pour, MD  gabapentin (NEURONTIN) 300 MG capsule Take 1 capsule by mouth 3 times a day 09/19/21     glucose blood (FREESTYLE TEST STRIPS) test strip Use 1 to test glucose daily 06/24/22     glucose blood test strip use as directed to check fasting glucose daily 09/27/21     HYDROcodone-acetaminophen (NORCO) 7.5-325 MG tablet Take 1 tablet  by mouth 4 times daily as needed for pain 03/21/22     HYDROcodone-acetaminophen (NORCO) 7.5-325 MG tablet Take 1 tablet by mouth every 6 hours 03/21/22     HYDROcodone-acetaminophen (NORCO) 7.5-325 MG tablet Take 1 tablet by mouth every 6 (six) hours. 04/18/22     HYDROcodone-acetaminophen (NORCO) 7.5-325 MG tablet Take 1 tablet by mouth every 6 (six) hours. 05/20/22     HYDROcodone-acetaminophen (NORCO) 7.5-325 MG tablet Take 1 tablet by mouth every 6 (six) hours. 06/12/22     HYDROcodone-acetaminophen (NORCO) 7.5-325 MG tablet Take 1 tablet by mouth every 6 (six) hours. 07/14/22     losartan-hydrochlorothiazide (HYZAAR) 50-12.5 MG tablet Take 1  tablet by mouth daily. 07/07/22     meloxicam (MOBIC) 15 MG tablet Take 1 tablet by mouth once a day 07/30/21     meloxicam (MOBIC) 15 MG tablet Take 1 tablet by mouth once a day. 08/27/21     meloxicam (MOBIC) 15 MG tablet Take 1 tablet by mouth once a day. 09/27/21     meloxicam (MOBIC) 15 MG tablet Take 1 tablet by mouth once daily 10/29/21     meloxicam (MOBIC) 15 MG tablet Take 1 tablet by mouth once a day. 11/26/21     meloxicam (MOBIC) 15 MG tablet Take 1 tablet by mouth daily 12/24/21     meloxicam (MOBIC) 15 MG tablet Take 1 tablet by mouth once a day. 01/23/22     meloxicam (MOBIC) 15 MG tablet Take 1 tablet by mouth once a day. 02/21/22     meloxicam (MOBIC) 15 MG tablet Take 1 tablet by mouth once a day. 03/21/22     meloxicam (MOBIC) 15 MG tablet Take 1 tablets by mouth daily after a meal 04/18/22     meloxicam (MOBIC) 15 MG tablet Take 1 tablet (15 mg total) by mouth daily after meals 06/12/22     meloxicam (MOBIC) 7.5 MG tablet Take 1 tablet (7.5 mg) by mouth once daily 09/19/21     metFORMIN (GLUCOPHAGE) 1000 MG tablet TAKE 1 TABLET BY MOUTH 2 TIMES PER DAY WITH MORNING AND EVENING MEALS 06/21/21 06/21/22    metFORMIN (GLUCOPHAGE) 1000 MG tablet Take 1 tablet (1,000 mg) by mouth 2 times per day with morning and evening meals 10/11/21     Multiple Vitamin (MULTIVITAMIN WITH MINERALS) TABS tablet Take 1 tablet by mouth daily.    [provider]  Naloxone HCl (KLOXXADO) 8 MG/0.1ML LIQD Use as directed when needed for opioid emergency Patient not taking: No sig reported 04/02/21     omeprazole (PRILOSEC) 40 MG capsule TAKE 1 CAPSULE BY MOUTH ONCE A DAY BEFORE A MEAL 11/14/20 11/14/21  Delford Field, FNP  omeprazole (PRILOSEC) 40 MG capsule Take 1 capsule (40 mg total) by mouth daily before a meal 01/15/22     ondansetron (ZOFRAN) 4 MG tablet Take 1 tablet (4 mg total) by mouth every 6 (six) hours. Take 1 tablet by mouth every 6 hours if experiencing nausea or vomiting 08/24/21   Blue,  Soijett A, PA-C  oseltamivir (TAMIFLU) 75 MG capsule Take 1 capsule (75 mg total) by mouth 2 (two) times daily. 10/04/21   Scot Jun, FNP  Potassium Chloride ER 20 MEQ TBCR Take 1 tablet by mouth daily with food. 01/15/22     triamterene-hydrochlorothiazide (MAXZIDE-25) 37.5-25 MG tablet TAKE 1 TABLET BY MOUTH ONCE A DAY 11/14/20 11/14/21  Lily Peer A, FNP  triamterene-hydrochlorothiazide (MAXZIDE-25) 37.5-25 MG tablet Take 1 tablet by mouth daily. 01/15/22  Family History Family History  Problem Relation Age of Onset   Carpal tunnel syndrome Sister    Breast cancer Maternal Grandmother    Colon cancer Neg Hx    Esophageal cancer Neg Hx     Social History Social History   Tobacco Use   Smoking status: Every Day    Packs/day: 0.25    Types: Cigarettes   Smokeless tobacco: Never  Vaping Use   Vaping Use: Never used  Substance Use Topics   Alcohol use: No    Alcohol/week: 0.0 standard drinks of alcohol   Drug use: No     Allergies   Lisinopril and Montelukast   Review of Systems Review of Systems  Constitutional: Negative.   Respiratory: Negative.    Cardiovascular: Negative.   Genitourinary: Negative.      Physical Exam Triage Vital Signs ED Triage Vitals  Enc Vitals Group     BP 07/23/22 1934 123/81     Pulse Rate 07/23/22 1934 97     Resp 07/23/22 1934 20     Temp 07/23/22 1934 98.7 F (37.1 C)     Temp Source 07/23/22 1934 Oral     SpO2 07/23/22 1934 98 %     Weight --      Height --      Head Circumference --      Peak Flow --      Pain Score 07/23/22 1935 0     Pain Loc --      Pain Edu? --      Excl. in [place]? --    No data found.  Updated Vital Signs BP 123/81 (BP Location: Right Arm)   Pulse 97   Temp 98.7 F (37.1 C) (Oral)   Resp 20   SpO2 98%   Visual Acuity Right Eye Distance:   Left Eye Distance:   Bilateral Distance:    Right Eye Near:   Left Eye Near:    Bilateral Near:     Physical Exam Constitutional:       Appearance: Normal appearance.  Eyes:     Extraocular Movements: Extraocular movements intact.  Pulmonary:     Effort: Pulmonary effort is normal.  Genitourinary:    Comments: deferred Neurological:     Mental Status: He is alert and oriented to person, place, and time. Mental status is at baseline.  Psychiatric:        Mood and Affect: Mood normal.        Behavior: Behavior normal.      UC Treatments / Results  Labs (all labs ordered are listed, but only abnormal results are displayed) Labs Reviewed  HSV 1 ANTIBODY, IGG  HSV 2 ANTIBODY, IGG  HIV ANTIBODY (ROUTINE TESTING W REFLEX)  RPR  CYTOLOGY, (ORAL, ANAL, URETHRAL) ANCILLARY ONLY    EKG   Radiology No results found.  Procedures Procedures (including critical care time)  Medications Ordered in UC Medications - No data to display  Initial Impression / Assessment and Plan / UC Course  I have reviewed the triage vital signs and the nursing notes.  Pertinent labs & imaging results that were available during my care of the patient were reviewed by me and considered in my medical decision making (see chart for details).  Routine screening for STI   Discussed herpes simplex and that if he has noticed lesions that he will not need treatment at this time, STI labs pending will treat per protocol, advised abstinence until lab results, and/or treatment is  complete, advised condom use during all sexual encounters moving, may follow-up with urgent care as needed   Final Clinical Impressions(s) / UC Diagnoses   Final diagnoses:  Routine screening for STI (sexually transmitted infection)     Discharge Instructions      Labs pending 2-3 days, you will be contacted if positive for any sti and treatment will be sent to the pharmacy, you will have to return to the clinic if positive for gonorrhea to receive treatment   Herpes testing pending, herpes occurs intermittently and spontaneously, typically flares during hot  levels of stress or during illness, it is also possible that she will never experience an outbreak, if you Begin to have blistering lesions to the penis that are itching or painful, these are signs of a herpes outbreak, please follow-up to be seen as soon as possible so that she may receive antiviral treatment which will help to clear them faster  Please refrain from having sex until labs results, if positive please refrain from having sex until treatment complete and symptoms resolve   If positive for herpes, HIV, Syphilis, Chlamydia  gonorrhea or trichomoniasis please notify partner or partners so they may tested as well  Moving forward, it is recommended you use some form of protection against the transmission of sti infections  such as condoms or dental dams with each sexual encounter     ED Prescriptions   None    PDMP not reviewed this encounter.   Hans Eden, NP 07/23/22 [phone]

## 2022-07-23 NOTE — ED Triage Notes (Signed)
Pt states his wife tested positive for herpes. Pt denies sx's requesting STD testing.

## 2022-07-23 NOTE — Discharge Instructions (Addendum)
Labs pending 2-3 days, you will be contacted if positive for any sti and treatment will be sent to the pharmacy, you will have to return to the clinic if positive for gonorrhea to receive treatment   Herpes testing pending, herpes occurs intermittently and spontaneously, typically flares during hot levels of stress or during illness, it is also possible that she will never experience an outbreak, if you Begin to have blistering lesions to the penis that are itching or painful, these are signs of a herpes outbreak, please follow-up to be seen as soon as possible so that she may receive antiviral treatment which will help to clear them faster  Please refrain from having sex until labs results, if positive please refrain from having sex until treatment complete and symptoms resolve   If positive for herpes, HIV, Syphilis, Chlamydia  gonorrhea or trichomoniasis please notify partner or partners so they may tested as well  Moving forward, it is recommended you use some form of protection against the transmission of sti infections  such as condoms or dental dams with each sexual encounter

## 2022-07-24 LAB — CYTOLOGY, (ORAL, ANAL, URETHRAL) ANCILLARY ONLY
Chlamydia: NEGATIVE
Comment: NEGATIVE
Comment: NEGATIVE
Comment: NORMAL
Neisseria Gonorrhea: NEGATIVE
Trichomonas: NEGATIVE

## 2022-07-24 LAB — RPR: RPR Ser Ql: NONREACTIVE

## 2022-07-25 LAB — HSV 1 ANTIBODY, IGG: HSV 1 Glycoprotein G Ab, IgG: 1.29 index — ABNORMAL HIGH (ref 0.00–0.90)

## 2022-07-25 LAB — HSV 2 ANTIBODY, IGG: HSV 2 Glycoprotein G Ab, IgG: 13.7 index — ABNORMAL HIGH (ref 0.00–0.90)

## 2022-08-06 ENCOUNTER — Other Ambulatory Visit (HOSPITAL_COMMUNITY): Payer: Self-pay

## 2022-08-07 ENCOUNTER — Other Ambulatory Visit (HOSPITAL_COMMUNITY): Payer: Self-pay

## 2022-08-08 ENCOUNTER — Other Ambulatory Visit (HOSPITAL_COMMUNITY): Payer: Self-pay

## 2022-08-11 ENCOUNTER — Other Ambulatory Visit (HOSPITAL_COMMUNITY): Payer: Self-pay

## 2022-08-12 ENCOUNTER — Other Ambulatory Visit (HOSPITAL_COMMUNITY): Payer: Self-pay

## 2022-08-12 MED ORDER — HYDROCODONE-ACETAMINOPHEN 7.5-325 MG PO TABS
ORAL_TABLET | ORAL | 0 refills | Status: DC
  Filled 2022-08-12: qty 120, 30d supply, fill #0

## 2022-08-12 MED ORDER — GABAPENTIN 300 MG PO CAPS
300.0000 mg | ORAL_CAPSULE | Freq: Three times a day (TID) | ORAL | 3 refills | Status: DC
  Filled 2022-08-12: qty 270, 90d supply, fill #0
  Filled 2022-11-05: qty 270, 90d supply, fill #1
  Filled 2023-01-29: qty 270, 90d supply, fill #2
  Filled 2023-05-04: qty 270, 90d supply, fill #3

## 2022-08-12 MED ORDER — MELOXICAM 15 MG PO TABS
ORAL_TABLET | ORAL | 0 refills | Status: DC
  Filled 2022-08-12: qty 30, 30d supply, fill #0

## 2022-08-12 MED ORDER — CYCLOBENZAPRINE HCL 10 MG PO TABS
ORAL_TABLET | ORAL | 0 refills | Status: DC
  Filled 2022-08-12: qty 60, 30d supply, fill #0

## 2022-08-14 ENCOUNTER — Other Ambulatory Visit (HOSPITAL_COMMUNITY): Payer: Self-pay

## 2022-09-09 ENCOUNTER — Other Ambulatory Visit (HOSPITAL_COMMUNITY): Payer: Self-pay

## 2022-09-09 MED ORDER — MELOXICAM 15 MG PO TABS: 15.0000 mg | ORAL_TABLET | Freq: Every day | ORAL | 0 refills | Status: DC

## 2022-09-09 MED ORDER — HYDROCODONE-ACETAMINOPHEN 7.5-325 MG PO TABS
ORAL_TABLET | ORAL | 0 refills | Status: DC
  Filled 2022-09-09: qty 120, 30d supply, fill #0

## 2022-09-10 ENCOUNTER — Other Ambulatory Visit (HOSPITAL_COMMUNITY): Payer: Self-pay

## 2022-09-11 ENCOUNTER — Other Ambulatory Visit (HOSPITAL_COMMUNITY): Payer: Self-pay

## 2022-09-12 ENCOUNTER — Other Ambulatory Visit (HOSPITAL_COMMUNITY): Payer: Self-pay

## 2022-09-12 MED ORDER — LOSARTAN POTASSIUM-HCTZ 50-12.5 MG PO TABS
1.0000 | ORAL_TABLET | Freq: Every day | ORAL | 1 refills | Status: DC
  Filled 2022-09-12: qty 30, 30d supply, fill #0
  Filled ????-??-??: fill #1

## 2022-09-12 MED ORDER — CYCLOBENZAPRINE HCL 10 MG PO TABS
10.0000 mg | ORAL_TABLET | Freq: Two times a day (BID) | ORAL | 0 refills | Status: DC
  Filled 2022-09-12: qty 60, 30d supply, fill #0

## 2022-09-12 MED ORDER — FENOFIBRATE 48 MG PO TABS
48.0000 mg | ORAL_TABLET | Freq: Every day | ORAL | 1 refills | Status: AC
  Filled 2022-09-12: qty 90, 90d supply, fill #0
  Filled 2022-11-19 – 2023-03-07 (×2): qty 90, 90d supply, fill #1

## 2022-09-15 ENCOUNTER — Other Ambulatory Visit (HOSPITAL_COMMUNITY): Payer: Self-pay

## 2022-09-16 ENCOUNTER — Other Ambulatory Visit (HOSPITAL_COMMUNITY): Payer: Self-pay

## 2022-09-16 MED ORDER — ATORVASTATIN CALCIUM 40 MG PO TABS
40.0000 mg | ORAL_TABLET | Freq: Every day | ORAL | 3 refills | Status: DC
  Filled 2022-09-16: qty 90, 90d supply, fill #0
  Filled 2022-12-12: qty 90, 90d supply, fill #1

## 2022-09-16 MED ORDER — TRIAMCINOLONE ACETONIDE 0.1 % EX CREA
TOPICAL_CREAM | Freq: Two times a day (BID) | CUTANEOUS | 1 refills | Status: AC
  Filled 2022-09-16: qty 45, 30d supply, fill #0

## 2022-09-16 MED ORDER — LOSARTAN POTASSIUM-HCTZ 50-12.5 MG PO TABS
1.0000 | ORAL_TABLET | Freq: Every day | ORAL | 1 refills | Status: DC
  Filled 2022-09-16 – 2022-10-07 (×3): qty 90, 90d supply, fill #0
  Filled 2023-01-01: qty 90, 90d supply, fill #1

## 2022-09-16 MED ORDER — METFORMIN HCL 1000 MG PO TABS
1000.0000 mg | ORAL_TABLET | Freq: Two times a day (BID) | ORAL | 3 refills | Status: DC
  Filled 2022-09-16 – 2022-11-19 (×2): qty 180, 90d supply, fill #0
  Filled 2023-01-29: qty 180, 90d supply, fill #1
  Filled 2023-05-06: qty 180, 90d supply, fill #2
  Filled 2023-08-11: qty 180, 90d supply, fill #3

## 2022-09-16 MED ORDER — FENOFIBRATE 48 MG PO TABS
48.0000 mg | ORAL_TABLET | Freq: Every day | ORAL | 1 refills | Status: DC
  Filled 2022-09-16 – 2022-11-26 (×2): qty 90, 90d supply, fill #0
  Filled 2023-06-04: qty 90, 90d supply, fill #1

## 2022-09-25 ENCOUNTER — Other Ambulatory Visit: Payer: Self-pay | Admitting: Internal Medicine

## 2022-09-25 DIAGNOSIS — N1831 Chronic kidney disease, stage 3a: Secondary | ICD-10-CM

## 2022-10-02 ENCOUNTER — Ambulatory Visit
Admission: RE | Admit: 2022-10-02 | Discharge: 2022-10-02 | Disposition: A | Discharge status: Home / Self Care | Payer: Medicare Other | Source: Ambulatory Visit | Attending: Internal Medicine | Admitting: Internal Medicine

## 2022-10-02 DIAGNOSIS — N1831 Chronic kidney disease, stage 3a: Secondary | ICD-10-CM

## 2022-10-04 ENCOUNTER — Other Ambulatory Visit (HOSPITAL_COMMUNITY): Payer: Self-pay

## 2022-10-07 ENCOUNTER — Other Ambulatory Visit (HOSPITAL_COMMUNITY): Payer: Self-pay

## 2022-10-07 ENCOUNTER — Other Ambulatory Visit: Payer: Self-pay

## 2022-10-09 ENCOUNTER — Other Ambulatory Visit (HOSPITAL_COMMUNITY): Payer: Self-pay

## 2022-10-09 MED ORDER — MELOXICAM 15 MG PO TABS
15.0000 mg | ORAL_TABLET | Freq: Every day | ORAL | 0 refills | Status: DC
  Filled 2022-10-09: qty 30, 30d supply, fill #0

## 2022-10-09 MED ORDER — CYCLOBENZAPRINE HCL 10 MG PO TABS
10.0000 mg | ORAL_TABLET | Freq: Two times a day (BID) | ORAL | 0 refills | Status: DC
  Filled 2022-10-09: qty 60, 30d supply, fill #0

## 2022-10-09 MED ORDER — HYDROCODONE-ACETAMINOPHEN 7.5-325 MG PO TABS
1.0000 | ORAL_TABLET | Freq: Four times a day (QID) | ORAL | 0 refills | Status: DC
  Filled 2022-10-09: qty 120, 30d supply, fill #0

## 2022-10-09 MED ORDER — OMEPRAZOLE 40 MG PO CPDR
40.0000 mg | DELAYED_RELEASE_CAPSULE | Freq: Every day | ORAL | 3 refills | Status: DC
  Filled 2022-10-09: qty 90, 90d supply, fill #0
  Filled 2023-01-03: qty 90, 90d supply, fill #1
  Filled 2023-04-02: qty 90, 90d supply, fill #2
  Filled 2023-06-27 – 2023-06-29 (×2): qty 90, 90d supply, fill #3

## 2022-10-28 ENCOUNTER — Other Ambulatory Visit (HOSPITAL_COMMUNITY): Payer: Self-pay

## 2022-11-05 ENCOUNTER — Other Ambulatory Visit (HOSPITAL_COMMUNITY): Payer: Self-pay

## 2022-11-06 ENCOUNTER — Other Ambulatory Visit (HOSPITAL_COMMUNITY): Payer: Self-pay

## 2022-11-06 MED ORDER — HYDROCODONE-ACETAMINOPHEN 7.5-325 MG PO TABS
1.0000 | ORAL_TABLET | Freq: Four times a day (QID) | ORAL | 0 refills | Status: DC
  Filled 2022-11-06: qty 120, 30d supply, fill #0

## 2022-11-06 MED ORDER — CYCLOBENZAPRINE HCL 10 MG PO TABS
10.0000 mg | ORAL_TABLET | Freq: Two times a day (BID) | ORAL | 0 refills | Status: DC
  Filled 2022-11-06: qty 60, 30d supply, fill #0

## 2022-11-06 MED ORDER — MELOXICAM 15 MG PO TABS
15.0000 mg | ORAL_TABLET | Freq: Every day | ORAL | 0 refills | Status: DC
  Filled 2022-11-06: qty 30, 30d supply, fill #0

## 2022-11-19 ENCOUNTER — Other Ambulatory Visit (HOSPITAL_COMMUNITY): Payer: Self-pay

## 2022-11-26 ENCOUNTER — Other Ambulatory Visit (HOSPITAL_COMMUNITY): Payer: Self-pay

## 2022-12-04 ENCOUNTER — Other Ambulatory Visit (HOSPITAL_COMMUNITY): Payer: Self-pay

## 2022-12-04 MED ORDER — MELOXICAM 15 MG PO TABS
15.0000 mg | ORAL_TABLET | Freq: Every day | ORAL | 0 refills | Status: DC
  Filled 2022-12-04: qty 30, 30d supply, fill #0

## 2022-12-04 MED ORDER — HYDROCODONE-ACETAMINOPHEN 7.5-325 MG PO TABS
1.0000 | ORAL_TABLET | Freq: Four times a day (QID) | ORAL | 0 refills | Status: DC
  Filled 2022-12-04: qty 120, 30d supply, fill #0

## 2022-12-04 MED ORDER — CYCLOBENZAPRINE HCL 10 MG PO TABS
10.0000 mg | ORAL_TABLET | Freq: Two times a day (BID) | ORAL | 0 refills | Status: AC
  Filled 2022-12-04: qty 60, 30d supply, fill #0

## 2022-12-12 ENCOUNTER — Other Ambulatory Visit (HOSPITAL_COMMUNITY): Payer: Self-pay

## 2023-01-01 ENCOUNTER — Other Ambulatory Visit (HOSPITAL_COMMUNITY): Payer: Self-pay

## 2023-01-01 MED ORDER — HYDROCODONE-ACETAMINOPHEN 7.5-325 MG PO TABS
1.0000 | ORAL_TABLET | Freq: Four times a day (QID) | ORAL | 0 refills | Status: DC
  Filled 2023-01-01: qty 120, 30d supply, fill #0

## 2023-01-03 ENCOUNTER — Other Ambulatory Visit (HOSPITAL_COMMUNITY): Payer: Self-pay

## 2023-01-25 ENCOUNTER — Emergency Department (HOSPITAL_COMMUNITY)
Admission: EM | Admit: 2023-01-25 | Discharge: 2023-01-25 | Disposition: A | Discharge status: Home / Self Care | Payer: 59 | Attending: Emergency Medicine | Admitting: Emergency Medicine

## 2023-01-25 ENCOUNTER — Other Ambulatory Visit: Payer: Self-pay

## 2023-01-25 ENCOUNTER — Encounter (HOSPITAL_COMMUNITY): Payer: Self-pay

## 2023-01-25 DIAGNOSIS — Z7984 Long term (current) use of oral hypoglycemic drugs: Secondary | ICD-10-CM | POA: Insufficient documentation

## 2023-01-25 DIAGNOSIS — L03317 Cellulitis of buttock: Secondary | ICD-10-CM | POA: Insufficient documentation

## 2023-01-25 DIAGNOSIS — E119 Type 2 diabetes mellitus without complications: Secondary | ICD-10-CM | POA: Insufficient documentation

## 2023-01-25 DIAGNOSIS — I10 Essential (primary) hypertension: Secondary | ICD-10-CM | POA: Diagnosis not present

## 2023-01-25 MED ORDER — DOXYCYCLINE HYCLATE 100 MG PO TABS
100.0000 mg | ORAL_TABLET | Freq: Once | ORAL | Status: AC
  Administered 2023-01-25: 100 mg via ORAL
  Filled 2023-01-25: qty 1

## 2023-01-25 MED ORDER — DOXYCYCLINE HYCLATE 100 MG PO CAPS: 100.0000 mg | ORAL_CAPSULE | Freq: Two times a day (BID) | ORAL | 0 refills | Status: DC

## 2023-01-25 NOTE — Discharge Instructions (Signed)
Apply warm compresses/soak in a warm tub with Epsom salt for 20 min at a time. Take antibiotics as prescribed. Recheck with your doctor in 2 days. Return to the ER for fevers, worsening or concerning symptoms.

## 2023-01-25 NOTE — ED Provider Notes (Signed)
[facility] AT [facility] Provider Note   CSN: [id number] Arrival date & time: 01/25/23  2133     History  Chief Complaint  Patient presents with   Abscess    Antonio Huber is a 47 y.o. male.  47 year old male with past medical history of hypertension, diabetes, hyperlipidemia presents with complaint of abscess on the right and left buttocks.  Patient states that a few days ago he noticed a bump on the right buttocks, has been applying alcohol to the area, woke up today with enlargement of the bump on the right buttocks.  He tried to squeeze this while in the shower and was only able to express blood.  Denies fevers, chills.  No history of similar problems previously.  No other complaints or concerns.       Home Medications Prior to Admission medications   Medication Sig Start Date End Date Taking? Authorizing Provider  doxycycline (VIBRAMYCIN) 100 MG capsule Take 1 capsule (100 mg total) by mouth 2 (two) times daily. 01/25/23  Yes Jeannie Fend, PA-C  Accu-Chek Softclix Lancets lancets use as directed, check fasting glucose daily 09/27/21     atorvastatin (LIPITOR) 40 MG tablet TAKE 1 TABLET BY MOUTH DAILY 11/14/20 11/14/21  Verdene Lennert A, FNP  atorvastatin (LIPITOR) 40 MG tablet Take 1 Tablet by mouth daily. 09/16/22     benzonatate (TESSALON) 100 MG capsule Take 2 capsules (200 mg total) by mouth 3 (three) times daily as needed for cough. 10/04/21   Bing Neighbors, NP  Blood Glucose Monitoring Suppl (ACCU-CHEK GUIDE) w/Device KIT use as directed to check fasting glucose daily 09/27/21     Blood Glucose Monitoring Suppl (BLOOD GLUCOSE MONITOR SYSTEM) w/Device KIT Use as needed for blood glucose monitoring 06/24/22     Cholecalciferol (VITAMIN D3) 50 MCG (2000 UT) TABS Take 1 tablet by mouth daily after a meal 01/15/22     Continuous Blood Gluc Receiver (FREESTYLE LIBRE 14 DAY READER) DEVI Use as directed, check fasting glucose daily Patient  not taking: Reported on 07/05/2021 04/01/21     cyclobenzaprine (FLEXERIL) 10 MG tablet Take 1 tablet by mouth twice daily after a meal 08/12/22     cyclobenzaprine (FLEXERIL) 10 MG tablet Take 1 tablet (10 mg total) by mouth every 12 (twelve) hours. 12/04/22     fenofibrate (TRICOR) 48 MG tablet Take 1 tablet (48 mg) by mouth once daily 09/27/21     fenofibrate (TRICOR) 48 MG tablet Take 1 tablet (48 mg total) by mouth daily. 09/12/22     fenofibrate (TRICOR) 48 MG tablet Take 1 tablet (48 mg total) by mouth daily. 09/16/22     fluconazole (DIFLUCAN) 150 MG tablet Take 3 tablets (450 mg total) by mouth once a week for 26 doses. 11/12/21   McDonald, Rachelle Hora, DPM  gabapentin (NEURONTIN) 300 MG capsule Take 2 capsules (600 mg total) by mouth 3 (three) times daily. Patient taking differently: Take 300 mg by mouth 3 (three) times daily. 04/12/15   Tyrone Nine, MD  gabapentin (NEURONTIN) 300 MG capsule Take 1 capsule by mouth 3 times a day 08/12/22     glucose blood (FREESTYLE TEST STRIPS) test strip Use 1 to test glucose daily 06/24/22     glucose blood test strip use as directed to check fasting glucose daily 09/27/21     HYDROcodone-acetaminophen (NORCO) 7.5-325 MG tablet Take 1 tablet by mouth 4 times daily as needed for pain 03/21/22  HYDROcodone-acetaminophen (NORCO) 7.5-325 MG tablet Take 1 tablet by mouth every 6 (six) hours. 04/18/22     HYDROcodone-acetaminophen (NORCO) 7.5-325 MG tablet Take 1 tablet by mouth every 6 (six) hours. 05/20/22     HYDROcodone-acetaminophen (NORCO) 7.5-325 MG tablet Take 1 tablet by mouth every 6 (six) hours. 06/12/22     HYDROcodone-acetaminophen (NORCO) 7.5-325 MG tablet Take 1 tablet by mouth every 6 (six) hours. 07/14/22     HYDROcodone-acetaminophen (NORCO) 7.5-325 MG tablet Take 1 tablet by mouth every 6 hours 08/12/22     HYDROcodone-acetaminophen (NORCO) 7.5-325 MG tablet Take 1 tablet by mouth every 6 hours 09/09/22     HYDROcodone-acetaminophen (NORCO) 7.5-325 MG tablet  Take 1 tablet by mouth every 6 (six) hours. 10/09/22     HYDROcodone-acetaminophen (NORCO) 7.5-325 MG tablet Take 1 tablet by mouth every 6 (six) hours. 11/06/22     HYDROcodone-acetaminophen (NORCO) 7.5-325 MG tablet Take 1 tablet by mouth every 6 (six) hours. 12/04/22     HYDROcodone-acetaminophen (NORCO) 7.5-325 MG tablet Take 1 tablet by mouth every 6 (six) hours. 01/01/23     losartan-hydrochlorothiazide (HYZAAR) 50-12.5 MG tablet Take 1 tablet by mouth daily. 09/11/22     losartan-hydrochlorothiazide (HYZAAR) 50-12.5 MG tablet Take 1 tablet by mouth once daily 09/16/22     meloxicam (MOBIC) 15 MG tablet Take 1 tablet by mouth once a day 07/30/21     meloxicam (MOBIC) 15 MG tablet Take 1 tablet by mouth once a day. 08/27/21     meloxicam (MOBIC) 15 MG tablet Take 1 tablet by mouth once a day. 09/27/21     meloxicam (MOBIC) 15 MG tablet Take 1 tablet by mouth once daily 10/29/21     meloxicam (MOBIC) 15 MG tablet Take 1 tablet by mouth once a day. 11/26/21     meloxicam (MOBIC) 15 MG tablet Take 1 tablet by mouth daily 12/24/21     meloxicam (MOBIC) 15 MG tablet Take 1 tablet by mouth once a day. 01/23/22     meloxicam (MOBIC) 15 MG tablet Take 1 tablet by mouth once a day. 02/21/22     meloxicam (MOBIC) 15 MG tablet Take 1 tablet by mouth once a day. 03/21/22     meloxicam (MOBIC) 15 MG tablet Take 1 tablets by mouth daily after a meal 04/18/22     meloxicam (MOBIC) 15 MG tablet Take 1 tablet (15 mg total) by mouth daily after meals 06/12/22     meloxicam (MOBIC) 15 MG tablet Take 1 tablet (15 mg total) by mouth daily after a meal for 30 days 09/09/22     meloxicam (MOBIC) 15 MG tablet Take 1 tablet (15 mg total) by mouth daily after a meal 10/09/22     meloxicam (MOBIC) 15 MG tablet Take 1 tablet (15 mg total) by mouth daily after a meal. 11/06/22     meloxicam (MOBIC) 15 MG tablet Take 1 tablet (15 mg total) by mouth daily. 12/04/22     metFORMIN (GLUCOPHAGE) 1000 MG tablet TAKE 1 TABLET BY MOUTH 2 TIMES PER DAY  WITH MORNING AND EVENING MEALS 06/21/21 06/21/22    metFORMIN (GLUCOPHAGE) 1000 MG tablet Take 1 tablet (1,000 mg total) by mouth 2 (two) times daily with morning and evening meals. 09/16/22     Multiple Vitamin (MULTIVITAMIN WITH MINERALS) TABS tablet Take 1 tablet by mouth daily.    [provider]  Naloxone HCl (KLOXXADO) 8 MG/0.1ML LIQD Use as directed when needed for opioid emergency Patient not taking: No sig  reported 04/02/21     omeprazole (PRILOSEC) 40 MG capsule TAKE 1 CAPSULE BY MOUTH ONCE A DAY BEFORE A MEAL 11/14/20 01/16/22  Sherral Hammers, FNP  omeprazole (PRILOSEC) 40 MG capsule Take 1 capsule (40 mg total) by mouth daily before a meal 01/15/22     omeprazole (PRILOSEC) 40 MG capsule Take 1 capsule (40 mg total) by mouth daily before a meal 10/09/22     ondansetron (ZOFRAN) 4 MG tablet Take 1 tablet (4 mg total) by mouth every 6 (six) hours. Take 1 tablet by mouth every 6 hours if experiencing nausea or vomiting 08/24/21   Blue, Soijett A, PA-C  oseltamivir (TAMIFLU) 75 MG capsule Take 1 capsule (75 mg total) by mouth 2 (two) times daily. 10/04/21   Bing Neighbors, NP  Potassium Chloride ER 20 MEQ TBCR Take 1 tablet by mouth daily with food. 01/15/22     triamcinolone cream (KENALOG) 0.1 % Apply topically 2 times daily 09/16/22     triamterene-hydrochlorothiazide (MAXZIDE-25) 37.5-25 MG tablet TAKE 1 TABLET BY MOUTH ONCE A DAY 11/14/20 11/14/21  Verdene Lennert A, FNP  triamterene-hydrochlorothiazide (MAXZIDE-25) 37.5-25 MG tablet Take 1 tablet by mouth daily. 01/15/22         Allergies    Lisinopril, Montelukast, and Meloxicam    Review of Systems   Review of Systems Negative except as per HPI Physical Exam Updated Vital Signs BP (!) 140/95 (BP Location: Right Arm)   Pulse (!) 118   Temp 98.1 F (36.7 C) (Oral)   Resp 18   Ht 5\' 5"  (1.651 m)   Wt 113.4 kg   SpO2 91%   BMI 41.60 kg/m  Physical Exam Vitals and nursing note reviewed.  Constitutional:      General:  He is not in acute distress.    Appearance: He is well-developed. He is not diaphoretic.  HENT:     Head: Normocephalic and atraumatic.  Pulmonary:     Effort: Pulmonary effort is normal.  Musculoskeletal:        General: Swelling and tenderness present.  Skin:    General: Skin is warm and dry.     Findings: Erythema present.     Comments: Large area of mild erythema and induration to the upper right buttocks approximately 7 cm x 5 cm, no central fluctuance.  Second area noted to the left buttocks which is approximately 1 cm x 1 cm, mild overlying erythema induration, no fluctuance, no drainage.  Neurological:     Mental Status: He is alert and oriented to person, place, and time.  Psychiatric:        Behavior: Behavior normal.     ED Results / Procedures / Treatments   Labs (all labs ordered are listed, but only abnormal results are displayed) Labs Reviewed - No data to display  EKG None  Radiology No results found.  Procedures Ultrasound ED Soft Tissue  Date/Time: 01/25/2023 10:46 PM  Performed by: Jeannie Fend, PA-C Authorized by: Jeannie Fend, PA-C   Procedure details:    Indications: localization of abscess and evaluate for cellulitis     Transverse view:  Visualized   Longitudinal view:  Visualized   Images: archived   Location:    Location: buttocks     : bilateral. Findings:     no abscess present    cellulitis present    no foreign body present     Medications Ordered in ED Medications  doxycycline (VIBRA-TABS) tablet 100 mg (has no  administration in time range)    ED Course/ Medical Decision Making/ A&P                             Medical Decision Making  47 year old male with history of diabetes, hypertension, hyperlipidemia with concern for abscess to the left and right buttocks areas.  Ultrasound used to assist with exam, found to have inflammatory changes, no drainable collection identified.  Discussed results with patient.  Plan is to  treat with p.o. antibiotics.  Given strict return to ER precautions.  Advised to apply warm compresses to the area and recheck with PCP in 2 days.        Final Clinical Impression(s) / ED Diagnoses Final diagnoses:  Cellulitis of buttock    Rx / DC Orders ED Discharge Orders          Ordered    doxycycline (VIBRAMYCIN) 100 MG capsule  2 times daily        01/25/23 2242              Jeannie Fend, PA-C 01/25/23 2247    Vanetta Mulders, MD 01/25/23 2321

## 2023-01-25 NOTE — ED Triage Notes (Signed)
Pt. Arrives pov c/o 2 abscesses that he first noticed 2 days ago. Pt. States that he put alcohol on them and they began to swell even larger. Pt. States that he has also tried warm water on them and he noticed blood, but no pus.

## 2023-01-29 ENCOUNTER — Other Ambulatory Visit (HOSPITAL_COMMUNITY): Payer: Self-pay

## 2023-01-29 MED ORDER — HYDROCODONE-ACETAMINOPHEN 7.5-325 MG PO TABS
1.0000 | ORAL_TABLET | Freq: Four times a day (QID) | ORAL | 0 refills | Status: DC
  Filled 2023-01-31: qty 120, 30d supply, fill #0

## 2023-01-30 ENCOUNTER — Other Ambulatory Visit (HOSPITAL_COMMUNITY): Payer: Self-pay

## 2023-01-30 MED ORDER — POTASSIUM CHLORIDE ER 20 MEQ PO TBCR
1.0000 | EXTENDED_RELEASE_TABLET | Freq: Every day | ORAL | 3 refills | Status: DC
  Filled 2023-01-30: qty 90, 90d supply, fill #0
  Filled 2023-04-29 (×2): qty 90, 90d supply, fill #1
  Filled 2023-07-17: qty 90, 90d supply, fill #2
  Filled 2023-10-16: qty 90, 90d supply, fill #3

## 2023-01-31 ENCOUNTER — Other Ambulatory Visit (HOSPITAL_COMMUNITY): Payer: Self-pay

## 2023-02-17 ENCOUNTER — Other Ambulatory Visit (HOSPITAL_COMMUNITY): Payer: Self-pay

## 2023-02-17 MED ORDER — LOSARTAN POTASSIUM-HCTZ 50-12.5 MG PO TABS
1.0000 | ORAL_TABLET | Freq: Every day | ORAL | 1 refills | Status: DC
  Filled 2023-02-17 – 2023-04-02 (×2): qty 90, 90d supply, fill #0
  Filled 2023-06-27 – 2023-06-29 (×3): qty 90, 90d supply, fill #1

## 2023-02-17 MED ORDER — ATORVASTATIN CALCIUM 40 MG PO TABS
40.0000 mg | ORAL_TABLET | Freq: Every day | ORAL | 3 refills | Status: DC
  Filled 2023-02-17 – 2023-02-26 (×2): qty 90, 90d supply, fill #0
  Filled 2023-06-04: qty 90, 90d supply, fill #1
  Filled 2023-08-26: qty 90, 90d supply, fill #2
  Filled 2023-11-26: qty 90, 90d supply, fill #3

## 2023-02-26 ENCOUNTER — Other Ambulatory Visit (HOSPITAL_COMMUNITY): Payer: Self-pay

## 2023-02-26 MED ORDER — HYDROCODONE-ACETAMINOPHEN 7.5-325 MG PO TABS
1.0000 | ORAL_TABLET | Freq: Four times a day (QID) | ORAL | 0 refills | Status: DC
  Filled 2023-02-26 – 2023-02-28 (×3): qty 120, 30d supply, fill #0

## 2023-02-27 ENCOUNTER — Emergency Department (HOSPITAL_COMMUNITY)
Admission: EM | Admit: 2023-02-27 | Discharge: 2023-02-27 | Disposition: A | Discharge status: Home / Self Care | Payer: 59 | Attending: Emergency Medicine | Admitting: Emergency Medicine

## 2023-02-27 ENCOUNTER — Other Ambulatory Visit (HOSPITAL_COMMUNITY): Payer: Self-pay

## 2023-02-27 DIAGNOSIS — E119 Type 2 diabetes mellitus without complications: Secondary | ICD-10-CM | POA: Insufficient documentation

## 2023-02-27 DIAGNOSIS — I10 Essential (primary) hypertension: Secondary | ICD-10-CM | POA: Diagnosis not present

## 2023-02-27 DIAGNOSIS — Z79899 Other long term (current) drug therapy: Secondary | ICD-10-CM | POA: Insufficient documentation

## 2023-02-27 DIAGNOSIS — L0291 Cutaneous abscess, unspecified: Secondary | ICD-10-CM | POA: Insufficient documentation

## 2023-02-27 DIAGNOSIS — Z7984 Long term (current) use of oral hypoglycemic drugs: Secondary | ICD-10-CM | POA: Diagnosis not present

## 2023-02-27 MED ORDER — SULFAMETHOXAZOLE-TRIMETHOPRIM 800-160 MG PO TABS
1.0000 | ORAL_TABLET | Freq: Two times a day (BID) | ORAL | 0 refills | Status: AC
  Filled 2023-02-27: qty 14, 7d supply, fill #0

## 2023-02-27 NOTE — Discharge Instructions (Signed)
You were seen in the emergency department today for likely a small abscess to your right forearm.  I placed you on antibiotics.  Please use a warm compress multiple times a day on the right forearm to help bring fluid to the surface.  It is appropriate for this to begin draining on its own.  If this begins to continue swelling despite antibiotic use after about 48 hours please return to emergency department.

## 2023-02-27 NOTE — ED Provider Notes (Signed)
Antonio Huber EMERGENCY DEPARTMENT AT [facility] Provider Note   CSN: [id number] Arrival date & time: 02/27/23  8295     History  Chief Complaint  Patient presents with   Abscess    Antonio Huber is a 47 y.o. male.  With past medical history of diabetes, hypertriglyceridemia, obesity, hypertension who presents to the emergency department with possible abscess.  Patient states that symptoms began about 2 days ago.  He is having pain and some mild swelling to the right posterior forearm.  He he denies having any drainage from the area.  He does not shave his arms.  He denies any IV drug use.  Denies any systemic symptoms.   Abscess      Home Medications Prior to Admission medications   Medication Sig Start Date End Date Taking? Authorizing Provider  sulfamethoxazole-trimethoprim (BACTRIM DS) 800-160 MG tablet Take 1 tablet by mouth 2 (two) times daily for 7 days. 02/27/23 03/06/23 Yes Cristopher Peru, PA-C  Accu-Chek Softclix Lancets lancets use as directed, check fasting glucose daily 09/27/21     atorvastatin (LIPITOR) 40 MG tablet TAKE 1 TABLET BY MOUTH DAILY 11/14/20 11/14/21  Verdene Lennert A, FNP  atorvastatin (LIPITOR) 40 MG tablet Take 1 Tablet by mouth daily. 09/16/22     atorvastatin (LIPITOR) 40 MG tablet Take 1 tablet (40 mg total) by mouth daily. 02/17/23     benzonatate (TESSALON) 100 MG capsule Take 2 capsules (200 mg total) by mouth 3 (three) times daily as needed for cough. 10/04/21   Bing Neighbors, NP  Blood Glucose Monitoring Suppl (ACCU-CHEK GUIDE) w/Device KIT use as directed to check fasting glucose daily 09/27/21     Blood Glucose Monitoring Suppl (BLOOD GLUCOSE MONITOR SYSTEM) w/Device KIT Use as needed for blood glucose monitoring 06/24/22     Cholecalciferol (VITAMIN D3) 50 MCG (2000 UT) TABS Take 1 tablet by mouth daily after a meal 01/15/22     Continuous Blood Gluc Receiver (FREESTYLE LIBRE 14 DAY READER) DEVI Use as directed, check fasting  glucose daily Patient not taking: Reported on 07/05/2021 04/01/21     cyclobenzaprine (FLEXERIL) 10 MG tablet Take 1 tablet by mouth twice daily after a meal 08/12/22     cyclobenzaprine (FLEXERIL) 10 MG tablet Take 1 tablet (10 mg total) by mouth every 12 (twelve) hours. 12/04/22     doxycycline (VIBRAMYCIN) 100 MG capsule Take 1 capsule (100 mg total) by mouth 2 (two) times daily. 01/25/23   Jeannie Fend, PA-C  fenofibrate (TRICOR) 48 MG tablet Take 1 tablet (48 mg) by mouth once daily 09/27/21     fenofibrate (TRICOR) 48 MG tablet Take 1 tablet (48 mg total) by mouth daily. 09/12/22     fenofibrate (TRICOR) 48 MG tablet Take 1 tablet (48 mg total) by mouth daily. 09/16/22     fluconazole (DIFLUCAN) 150 MG tablet Take 3 tablets (450 mg total) by mouth once a week for 26 doses. 11/12/21   McDonald, Rachelle Hora, DPM  gabapentin (NEURONTIN) 300 MG capsule Take 2 capsules (600 mg total) by mouth 3 (three) times daily. Patient taking differently: Take 300 mg by mouth 3 (three) times daily. 04/12/15   Tyrone Nine, MD  gabapentin (NEURONTIN) 300 MG capsule Take 1 capsule by mouth 3 times a day 08/12/22     glucose blood (FREESTYLE TEST STRIPS) test strip Use 1 to test glucose daily 06/24/22     glucose blood test strip use as directed to check fasting glucose  daily 09/27/21     HYDROcodone-acetaminophen (NORCO) 7.5-325 MG tablet Take 1 tablet by mouth 4 times daily as needed for pain 03/21/22     HYDROcodone-acetaminophen (NORCO) 7.5-325 MG tablet Take 1 tablet by mouth every 6 (six) hours. 04/18/22     HYDROcodone-acetaminophen (NORCO) 7.5-325 MG tablet Take 1 tablet by mouth every 6 (six) hours. 05/20/22     HYDROcodone-acetaminophen (NORCO) 7.5-325 MG tablet Take 1 tablet by mouth every 6 (six) hours. 06/12/22     HYDROcodone-acetaminophen (NORCO) 7.5-325 MG tablet Take 1 tablet by mouth every 6 (six) hours. 07/14/22     HYDROcodone-acetaminophen (NORCO) 7.5-325 MG tablet Take 1 tablet by mouth every 6 hours 08/12/22      HYDROcodone-acetaminophen (NORCO) 7.5-325 MG tablet Take 1 tablet by mouth every 6 hours 09/09/22     HYDROcodone-acetaminophen (NORCO) 7.5-325 MG tablet Take 1 tablet by mouth every 6 (six) hours. 10/09/22     HYDROcodone-acetaminophen (NORCO) 7.5-325 MG tablet Take 1 tablet by mouth every 6 (six) hours. 11/06/22     HYDROcodone-acetaminophen (NORCO) 7.5-325 MG tablet Take 1 tablet by mouth every 6 (six) hours. 12/04/22     HYDROcodone-acetaminophen (NORCO) 7.5-325 MG tablet Take 1 tablet by mouth every 6 (six) hours. 01/01/23     HYDROcodone-acetaminophen (NORCO) 7.5-325 MG tablet Take 1 tablet by mouth every 6 (six) hours. 02/26/23     losartan-hydrochlorothiazide (HYZAAR) 50-12.5 MG tablet Take 1 tablet by mouth daily. 09/11/22     losartan-hydrochlorothiazide (HYZAAR) 50-12.5 MG tablet Take 1 tablet by mouth once daily 09/16/22     losartan-hydrochlorothiazide (HYZAAR) 50-12.5 MG tablet Take 1 tablet by mouth daily. 02/17/23     meloxicam (MOBIC) 15 MG tablet Take 1 tablet by mouth once a day 07/30/21     meloxicam (MOBIC) 15 MG tablet Take 1 tablet by mouth once a day. 08/27/21     meloxicam (MOBIC) 15 MG tablet Take 1 tablet by mouth once a day. 09/27/21     meloxicam (MOBIC) 15 MG tablet Take 1 tablet by mouth once daily 10/29/21     meloxicam (MOBIC) 15 MG tablet Take 1 tablet by mouth once a day. 11/26/21     meloxicam (MOBIC) 15 MG tablet Take 1 tablet by mouth daily 12/24/21     meloxicam (MOBIC) 15 MG tablet Take 1 tablet by mouth once a day. 01/23/22     meloxicam (MOBIC) 15 MG tablet Take 1 tablet by mouth once a day. 02/21/22     meloxicam (MOBIC) 15 MG tablet Take 1 tablet by mouth once a day. 03/21/22     meloxicam (MOBIC) 15 MG tablet Take 1 tablets by mouth daily after a meal 04/18/22     meloxicam (MOBIC) 15 MG tablet Take 1 tablet (15 mg total) by mouth daily after meals 06/12/22     meloxicam (MOBIC) 15 MG tablet Take 1 tablet (15 mg total) by mouth daily after a meal for 30 days 09/09/22      meloxicam (MOBIC) 15 MG tablet Take 1 tablet (15 mg total) by mouth daily after a meal 10/09/22     meloxicam (MOBIC) 15 MG tablet Take 1 tablet (15 mg total) by mouth daily after a meal. 11/06/22     meloxicam (MOBIC) 15 MG tablet Take 1 tablet (15 mg total) by mouth daily. 12/04/22     metFORMIN (GLUCOPHAGE) 1000 MG tablet TAKE 1 TABLET BY MOUTH 2 TIMES PER DAY WITH MORNING AND EVENING MEALS 06/21/21 06/21/22    metFORMIN (GLUCOPHAGE) 1000  MG tablet Take 1 tablet (1,000 mg total) by mouth 2 (two) times daily with morning and evening meals. 09/16/22     Multiple Vitamin (MULTIVITAMIN WITH MINERALS) TABS tablet Take 1 tablet by mouth daily.    [provider]  Naloxone HCl (KLOXXADO) 8 MG/0.1ML LIQD Use as directed when needed for opioid emergency Patient not taking: No sig reported 04/02/21     omeprazole (PRILOSEC) 40 MG capsule TAKE 1 CAPSULE BY MOUTH ONCE A DAY BEFORE A MEAL 11/14/20 01/16/22  Sherral Hammers, FNP  omeprazole (PRILOSEC) 40 MG capsule Take 1 capsule (40 mg total) by mouth daily before a meal 01/15/22     omeprazole (PRILOSEC) 40 MG capsule Take 1 capsule (40 mg total) by mouth daily before a meal 10/09/22     ondansetron (ZOFRAN) 4 MG tablet Take 1 tablet (4 mg total) by mouth every 6 (six) hours. Take 1 tablet by mouth every 6 hours if experiencing nausea or vomiting 08/24/21   Blue, Soijett A, PA-C  oseltamivir (TAMIFLU) 75 MG capsule Take 1 capsule (75 mg total) by mouth 2 (two) times daily. 10/04/21   Bing Neighbors, NP  Potassium Chloride ER 20 MEQ TBCR Take 1 tablet by mouth daily with food. 01/30/23     triamcinolone cream (KENALOG) 0.1 % Apply topically 2 times daily 09/16/22     triamterene-hydrochlorothiazide (MAXZIDE-25) 37.5-25 MG tablet TAKE 1 TABLET BY MOUTH ONCE A DAY 11/14/20 11/14/21  Verdene Lennert A, FNP  triamterene-hydrochlorothiazide (MAXZIDE-25) 37.5-25 MG tablet Take 1 tablet by mouth daily. 01/15/22         Allergies    Lisinopril, Montelukast, and  Meloxicam    Review of Systems   Review of Systems  Skin:  Positive for wound.  All other systems reviewed and are negative.   Physical Exam Updated Vital Signs BP 106/79 (BP Location: Left Arm)   Pulse 93   Temp 98.2 F (36.8 C) (Oral)   Resp 16   Ht 5\' 5"  (1.651 m)   Wt 86.2 kg   SpO2 100%   BMI 31.62 kg/m  Physical Exam Vitals and nursing note reviewed.  HENT:     Head: Normocephalic and atraumatic.  Eyes:     General: No scleral icterus. Pulmonary:     Effort: Pulmonary effort is normal. No respiratory distress.  Skin:    Findings: No rash.     Comments: There is about a 2 cm area of swelling to the right posterior forearm.  There is no obvious head to the small abscess.  There is no overlying cellulitis.  There is no fluctuance or induration at this time.  I placed a ultrasound probe over the swelling and do not appreciate any significant pocket of fluid.  Neurological:     General: No focal deficit present.     Mental Status: He is alert.  Psychiatric:        Mood and Affect: Mood normal.        Behavior: Behavior normal.        Thought Content: Thought content normal.        Judgment: Judgment normal.     ED Results / Procedures / Treatments   Labs (all labs ordered are listed, but only abnormal results are displayed) Labs Reviewed - No data to display  EKG None  Radiology No results found.  Procedures Ultrasound ED Soft Tissue  Date/Time: 02/27/2023 9:21 AM  Performed by: Cristopher Peru, PA-C Authorized by: Cristopher Peru, PA-C  Procedure details:    Indications: localization of abscess     Transverse view:  Visualized   Longitudinal view:  Visualized   Images: not archived   Location:    Location: upper extremity     Side:  Right Findings:     no abscess present    no cellulitis present    no foreign body present    Medications Ordered in ED Medications - No data to display  ED Course/ Medical Decision Making/ A&P   {     Medical Decision Making Initial Impression and Ddx 47 year old male who presents to the emergency department with swelling to the right forearm Patient PMH that increases complexity of ED encounter: Diabetes  Interpretation of Diagnostics I independent reviewed and interpreted the labs as followed: Not indicated  - I independently visualized the following imaging with scope of interpretation limited to determining acute life threatening conditions related to emergency care: Ultrasound at bedside with no discrete fluid pocket  Patient Reassessment and Ultimate Disposition/Management 47 year old male who presents to the emergency department with swelling over the right posterior forearm.  This is not over the elbow joint. There is no significant erythema, fluctuance or induration.  The area is about 2 cm in total.  Appears to be the beginning of an abscess.  I placed an ultrasound probe over the area which shows no discrete area of fluid to probe at this time.  He has no systemic symptoms.  There is no lymphangitic spreading that I see.  He has no history of IV drug use.  He does have a history of diabetes.  Will place him on Bactrim for now.  Have instructed him return if this worsens.  Also instructed him to use warm compresses to help express fluid pus.  He verbalized understanding.  Will return if this worsens despite antibiotic use or beginning to have systemic symptoms.  Otherwise feel that he is appropriate for discharge at this time.  Patient management required discussion with the following services or consulting groups:  None  Complexity of Problems Addressed Acute uncomplicated illness or injury with no diagnostics  Additional Data Reviewed and Analyzed Further history obtained from: Past medical history and medications listed in the EMR, Prior ED visit notes, and Care Everywhere  Patient Encounter Risk Assessment Prescriptions  Final Clinical Impression(s) / ED  Diagnoses Final diagnoses:  Abscess    Rx / DC Orders ED Discharge Orders          Ordered    sulfamethoxazole-trimethoprim (BACTRIM DS) 800-160 MG tablet  2 times daily        02/27/23 0918              Cristopher Peru, PA-C 02/27/23 0865    Alvira Monday, MD 02/28/23 [phone]

## 2023-02-27 NOTE — ED Triage Notes (Signed)
Pt arrived via POV. C/o abscess in R forearm for several days. No c/o fever.

## 2023-02-28 ENCOUNTER — Other Ambulatory Visit (HOSPITAL_COMMUNITY): Payer: Self-pay

## 2023-03-02 ENCOUNTER — Other Ambulatory Visit: Payer: Self-pay

## 2023-03-02 ENCOUNTER — Emergency Department (HOSPITAL_COMMUNITY)
Admission: EM | Admit: 2023-03-02 | Discharge: 2023-03-03 | Disposition: A | Discharge status: Home / Self Care | Payer: 59 | Attending: Emergency Medicine | Admitting: Emergency Medicine

## 2023-03-02 ENCOUNTER — Encounter (HOSPITAL_COMMUNITY): Payer: Self-pay

## 2023-03-02 DIAGNOSIS — L02413 Cutaneous abscess of right upper limb: Secondary | ICD-10-CM | POA: Insufficient documentation

## 2023-03-02 DIAGNOSIS — I10 Essential (primary) hypertension: Secondary | ICD-10-CM | POA: Insufficient documentation

## 2023-03-02 DIAGNOSIS — E119 Type 2 diabetes mellitus without complications: Secondary | ICD-10-CM | POA: Insufficient documentation

## 2023-03-02 DIAGNOSIS — Z7984 Long term (current) use of oral hypoglycemic drugs: Secondary | ICD-10-CM | POA: Diagnosis not present

## 2023-03-02 LAB — COMPREHENSIVE METABOLIC PANEL
ALT: 16 U/L (ref 0–44)
AST: 24 U/L (ref 15–41)
Albumin: 3.9 g/dL (ref 3.5–5.0)
Alkaline Phosphatase: 54 U/L (ref 38–126)
Anion gap: 10 (ref 5–15)
BUN: 20 mg/dL (ref 6–20)
CO2: 21 mmol/L — ABNORMAL LOW (ref 22–32)
Calcium: 8.8 mg/dL — ABNORMAL LOW (ref 8.9–10.3)
Chloride: 105 mmol/L (ref 98–111)
Creatinine, Ser: 2 mg/dL — ABNORMAL HIGH (ref 0.61–1.24)
GFR, Estimated: 40 mL/min — ABNORMAL LOW (ref >60)
Glucose, Bld: 94 mg/dL (ref 70–99)
Potassium: 3.8 mmol/L (ref 3.5–5.1)
Sodium: 136 mmol/L (ref 135–145)
Total Bilirubin: 0.5 mg/dL (ref 0.3–1.2)
Total Protein: 7.6 g/dL (ref 6.5–8.1)

## 2023-03-02 LAB — CBC WITH DIFFERENTIAL/PLATELET
Abs Immature Granulocytes: 0.03 10*3/uL (ref 0.00–0.07)
Basophils Absolute: 0.1 10*3/uL (ref 0.0–0.1)
Basophils Relative: 1 %
Eosinophils Absolute: 0.5 10*3/uL (ref 0.0–0.5)
Eosinophils Relative: 5 %
HCT: 38.3 % — ABNORMAL LOW (ref 39.0–52.0)
Hemoglobin: 12.3 g/dL — ABNORMAL LOW (ref 13.0–17.0)
Immature Granulocytes: 0 %
Lymphocytes Relative: 17 %
Lymphs Abs: 1.7 10*3/uL (ref 0.7–4.0)
MCH: 31 pg (ref 26.0–34.0)
MCHC: 32.1 g/dL (ref 30.0–36.0)
MCV: 96.5 fL (ref 80.0–100.0)
Monocytes Absolute: 0.7 10*3/uL (ref 0.1–1.0)
Monocytes Relative: 7 %
Neutro Abs: 6.9 10*3/uL (ref 1.7–7.7)
Neutrophils Relative %: 70 %
Platelets: 270 10*3/uL (ref 150–400)
RBC: 3.97 MIL/uL — ABNORMAL LOW (ref 4.22–5.81)
RDW: 12.5 % (ref 11.5–15.5)
WBC: 10 10*3/uL (ref 4.0–10.5)
nRBC: 0 % (ref 0.0–0.2)

## 2023-03-02 NOTE — ED Triage Notes (Signed)
Pt reports with an abscess on his right elbow. Pt states that he was seen Friday but now he thinks it is infected.

## 2023-03-03 DIAGNOSIS — L02413 Cutaneous abscess of right upper limb: Secondary | ICD-10-CM | POA: Diagnosis not present

## 2023-03-03 MED ORDER — LIDOCAINE-EPINEPHRINE (PF) 2 %-1:200000 IJ SOLN
10.0000 mL | Freq: Once | INTRAMUSCULAR | Status: AC
  Administered 2023-03-03: 10 mL via INTRADERMAL
  Filled 2023-03-03: qty 20

## 2023-03-03 NOTE — ED Provider Notes (Signed)
[place] EMERGENCY DEPARTMENT AT [facility] Provider Note   CSN: [id number] Arrival date & time: 03/02/23  2225     History  Chief Complaint  Patient presents with   Abscess    Antonio Huber is a 47 y.o. male.  The history is provided by the patient and medical records. No language interpreter was used.  Abscess    47 year old male significant history of hypertension, diabetes, hyperlipidemia presenting complaining of elbow pain.  Patient report for the past week he has had progressive pain and swelling noted to his right forearm.  Pain is throbbing and persistent with increased swelling.  He initially was seen in the ED several days ago for his complaint.  He was prescribed antibiotic and was told to return if no improvement.  He has been taking his antibiotic as prescribed but still noticing pain and swelling.  He is up-to-date with tetanus.  He denies any injury.  States he has history of diabetes and is concerned about infection.  No numbness.  Home Medications Prior to Admission medications   Medication Sig Start Date End Date Taking? Authorizing Provider  Accu-Chek Softclix Lancets lancets use as directed, check fasting glucose daily 09/27/21     atorvastatin (LIPITOR) 40 MG tablet TAKE 1 TABLET BY MOUTH DAILY 11/14/20 11/14/21  Verdene Lennert A, FNP  atorvastatin (LIPITOR) 40 MG tablet Take 1 Tablet by mouth daily. 09/16/22     atorvastatin (LIPITOR) 40 MG tablet Take 1 tablet (40 mg total) by mouth daily. 02/17/23     benzonatate (TESSALON) 100 MG capsule Take 2 capsules (200 mg total) by mouth 3 (three) times daily as needed for cough. 10/04/21   Bing Neighbors, NP  Blood Glucose Monitoring Suppl (ACCU-CHEK GUIDE) w/Device KIT use as directed to check fasting glucose daily 09/27/21     Blood Glucose Monitoring Suppl (BLOOD GLUCOSE MONITOR SYSTEM) w/Device KIT Use as needed for blood glucose monitoring 06/24/22     Cholecalciferol (VITAMIN D3) 50 MCG (2000 UT)  TABS Take 1 tablet by mouth daily after a meal 01/15/22     Continuous Blood Gluc Receiver (FREESTYLE LIBRE 14 DAY READER) DEVI Use as directed, check fasting glucose daily Patient not taking: Reported on 07/05/2021 04/01/21     cyclobenzaprine (FLEXERIL) 10 MG tablet Take 1 tablet by mouth twice daily after a meal 08/12/22     cyclobenzaprine (FLEXERIL) 10 MG tablet Take 1 tablet (10 mg total) by mouth every 12 (twelve) hours. 12/04/22     doxycycline (VIBRAMYCIN) 100 MG capsule Take 1 capsule (100 mg total) by mouth 2 (two) times daily. 01/25/23   Jeannie Fend, PA-C  fenofibrate (TRICOR) 48 MG tablet Take 1 tablet (48 mg) by mouth once daily 09/27/21     fenofibrate (TRICOR) 48 MG tablet Take 1 tablet (48 mg total) by mouth daily. 09/12/22     fenofibrate (TRICOR) 48 MG tablet Take 1 tablet (48 mg total) by mouth daily. 09/16/22     fluconazole (DIFLUCAN) 150 MG tablet Take 3 tablets (450 mg total) by mouth once a week for 26 doses. 11/12/21   McDonald, Rachelle Hora, DPM  gabapentin (NEURONTIN) 300 MG capsule Take 2 capsules (600 mg total) by mouth 3 (three) times daily. Patient taking differently: Take 300 mg by mouth 3 (three) times daily. 04/12/15   Tyrone Nine, MD  gabapentin (NEURONTIN) 300 MG capsule Take 1 capsule by mouth 3 times a day 08/12/22     glucose blood (FREESTYLE TEST  STRIPS) test strip Use 1 to test glucose daily 06/24/22     glucose blood test strip use as directed to check fasting glucose daily 09/27/21     HYDROcodone-acetaminophen (NORCO) 7.5-325 MG tablet Take 1 tablet by mouth 4 times daily as needed for pain 03/21/22     HYDROcodone-acetaminophen (NORCO) 7.5-325 MG tablet Take 1 tablet by mouth every 6 (six) hours. 04/18/22     HYDROcodone-acetaminophen (NORCO) 7.5-325 MG tablet Take 1 tablet by mouth every 6 (six) hours. 05/20/22     HYDROcodone-acetaminophen (NORCO) 7.5-325 MG tablet Take 1 tablet by mouth every 6 (six) hours. 06/12/22     HYDROcodone-acetaminophen (NORCO) 7.5-325 MG  tablet Take 1 tablet by mouth every 6 (six) hours. 07/14/22     HYDROcodone-acetaminophen (NORCO) 7.5-325 MG tablet Take 1 tablet by mouth every 6 hours 08/12/22     HYDROcodone-acetaminophen (NORCO) 7.5-325 MG tablet Take 1 tablet by mouth every 6 hours 09/09/22     HYDROcodone-acetaminophen (NORCO) 7.5-325 MG tablet Take 1 tablet by mouth every 6 (six) hours. 10/09/22     HYDROcodone-acetaminophen (NORCO) 7.5-325 MG tablet Take 1 tablet by mouth every 6 (six) hours. 11/06/22     HYDROcodone-acetaminophen (NORCO) 7.5-325 MG tablet Take 1 tablet by mouth every 6 (six) hours. 12/04/22     HYDROcodone-acetaminophen (NORCO) 7.5-325 MG tablet Take 1 tablet by mouth every 6 (six) hours. 01/01/23     HYDROcodone-acetaminophen (NORCO) 7.5-325 MG tablet Take 1 tablet by mouth every 6 (six) hours. 02/26/23     losartan-hydrochlorothiazide (HYZAAR) 50-12.5 MG tablet Take 1 tablet by mouth daily. 09/11/22     losartan-hydrochlorothiazide (HYZAAR) 50-12.5 MG tablet Take 1 tablet by mouth once daily 09/16/22     losartan-hydrochlorothiazide (HYZAAR) 50-12.5 MG tablet Take 1 tablet by mouth daily. 02/17/23     meloxicam (MOBIC) 15 MG tablet Take 1 tablet by mouth once a day 07/30/21     meloxicam (MOBIC) 15 MG tablet Take 1 tablet by mouth once a day. 08/27/21     meloxicam (MOBIC) 15 MG tablet Take 1 tablet by mouth once a day. 09/27/21     meloxicam (MOBIC) 15 MG tablet Take 1 tablet by mouth once daily 10/29/21     meloxicam (MOBIC) 15 MG tablet Take 1 tablet by mouth once a day. 11/26/21     meloxicam (MOBIC) 15 MG tablet Take 1 tablet by mouth daily 12/24/21     meloxicam (MOBIC) 15 MG tablet Take 1 tablet by mouth once a day. 01/23/22     meloxicam (MOBIC) 15 MG tablet Take 1 tablet by mouth once a day. 02/21/22     meloxicam (MOBIC) 15 MG tablet Take 1 tablet by mouth once a day. 03/21/22     meloxicam (MOBIC) 15 MG tablet Take 1 tablets by mouth daily after a meal 04/18/22     meloxicam (MOBIC) 15 MG tablet Take 1 tablet  (15 mg total) by mouth daily after meals 06/12/22     meloxicam (MOBIC) 15 MG tablet Take 1 tablet (15 mg total) by mouth daily after a meal for 30 days 09/09/22     meloxicam (MOBIC) 15 MG tablet Take 1 tablet (15 mg total) by mouth daily after a meal 10/09/22     meloxicam (MOBIC) 15 MG tablet Take 1 tablet (15 mg total) by mouth daily after a meal. 11/06/22     meloxicam (MOBIC) 15 MG tablet Take 1 tablet (15 mg total) by mouth daily. 12/04/22     metFORMIN (GLUCOPHAGE)  1000 MG tablet TAKE 1 TABLET BY MOUTH 2 TIMES PER DAY WITH MORNING AND EVENING MEALS 06/21/21 06/21/22    metFORMIN (GLUCOPHAGE) 1000 MG tablet Take 1 tablet (1,000 mg total) by mouth 2 (two) times daily with morning and evening meals. 09/16/22     Multiple Vitamin (MULTIVITAMIN WITH MINERALS) TABS tablet Take 1 tablet by mouth daily.    [provider]  Naloxone HCl (KLOXXADO) 8 MG/0.1ML LIQD Use as directed when needed for opioid emergency Patient not taking: No sig reported 04/02/21     omeprazole (PRILOSEC) 40 MG capsule TAKE 1 CAPSULE BY MOUTH ONCE A DAY BEFORE A MEAL 11/14/20 01/16/22  Sherral Hammers, FNP  omeprazole (PRILOSEC) 40 MG capsule Take 1 capsule (40 mg total) by mouth daily before a meal 01/15/22     omeprazole (PRILOSEC) 40 MG capsule Take 1 capsule (40 mg total) by mouth daily before a meal 10/09/22     ondansetron (ZOFRAN) 4 MG tablet Take 1 tablet (4 mg total) by mouth every 6 (six) hours. Take 1 tablet by mouth every 6 hours if experiencing nausea or vomiting 08/24/21   Blue, Soijett A, PA-C  oseltamivir (TAMIFLU) 75 MG capsule Take 1 capsule (75 mg total) by mouth 2 (two) times daily. 10/04/21   Bing Neighbors, NP  Potassium Chloride ER 20 MEQ TBCR Take 1 tablet by mouth daily with food. 01/30/23     sulfamethoxazole-trimethoprim (BACTRIM DS) 800-160 MG tablet Take 1 tablet by mouth 2 times daily for 7 days. 02/27/23 03/06/23  Cristopher Peru, PA-C  triamcinolone cream (KENALOG) 0.1 % Apply topically 2 times  daily 09/16/22     triamterene-hydrochlorothiazide (MAXZIDE-25) 37.5-25 MG tablet TAKE 1 TABLET BY MOUTH ONCE A DAY 11/14/20 11/14/21  Verdene Lennert A, FNP  triamterene-hydrochlorothiazide (MAXZIDE-25) 37.5-25 MG tablet Take 1 tablet by mouth daily. 01/15/22         Allergies    Lisinopril, Montelukast, and Meloxicam    Review of Systems   Review of Systems  All other systems reviewed and are negative.   Physical Exam Updated Vital Signs BP 102/70 (BP Location: Left Arm)   Pulse 100   Temp 98.3 F (36.8 C) (Oral)   Resp 18   SpO2 100%  Physical Exam Vitals and nursing note reviewed.  Constitutional:      General: He is not in acute distress.    Appearance: He is well-developed.  HENT:     Head: Atraumatic.  Eyes:     Conjunctiva/sclera: Conjunctivae normal.  Musculoskeletal:        General: Tenderness (Right upper extremity: There is an area of induration and fluctuant approximately 4 cm in diameter noted to the proximal dorsum of the forearm on not involving the elbowr joint) present.     Cervical back: Neck supple.  Skin:    Findings: No rash.  Neurological:     Mental Status: He is alert.     ED Results / Procedures / Treatments   Labs (all labs ordered are listed, but only abnormal results are displayed) Labs Reviewed  CBC WITH DIFFERENTIAL/PLATELET - Abnormal; Notable for the following components:      Result Value   RBC 3.97 (*)    Hemoglobin 12.3 (*)    HCT 38.3 (*)    All other components within normal limits  COMPREHENSIVE METABOLIC PANEL - Abnormal; Notable for the following components:   CO2 21 (*)    Creatinine, Ser 2.00 (*)    Calcium 8.8 (*)  GFR, Estimated 40 (*)    All other components within normal limits    EKG None  Radiology No results found.  Procedures .Marland KitchenIncision and Drainage  Date/Time: 03/03/2023 5:08 AM  Performed by: Fayrene Helper, PA-C Authorized by: Fayrene Helper, PA-C   Consent:    Consent obtained:  Verbal   Consent  given by:  Patient   Risks discussed:  Bleeding, incomplete drainage, pain and damage to other organs   Alternatives discussed:  No treatment Universal protocol:    Procedure explained and questions answered to patient or proxy's satisfaction: yes     Relevant documents present and verified: yes     Test results available : yes     Imaging studies available: yes     Required blood products, implants, devices, and special equipment available: yes     Site/side marked: yes     Immediately prior to procedure, a time out was called: yes     Patient identity confirmed:  Verbally with patient Location:    Type:  Abscess   Size:  3   Location:  Upper extremity   Upper extremity location:  Arm   Arm location:  R lower arm Pre-procedure details:    Skin preparation:  Betadine Anesthesia:    Anesthesia method:  Local infiltration   Local anesthetic:  Lidocaine 1% WITH epi Procedure type:    Complexity:  Simple Procedure details:    Incision types:  Single straight   Incision depth:  Subcutaneous   Wound management:  Probed and deloculated, irrigated with saline and extensive cleaning   Drainage:  Purulent   Drainage amount:  Moderate   Packing materials:  None Post-procedure details:    Procedure completion:  Tolerated well, no immediate complications     Medications Ordered in ED Medications  lidocaine-EPINEPHrine (XYLOCAINE W/EPI) 2 %-1:200000 (PF) injection 10 mL (10 mLs Intradermal Given by Other 03/03/23 5621)    ED Course/ Medical Decision Making/ A&P                             Medical Decision Making Amount and/or Complexity of Data Reviewed Labs: ordered.  Risk Prescription drug management.   BP 102/70 (BP Location: Left Arm)   Pulse 100   Temp 98.3 F (36.8 C) (Oral)   Resp 18   SpO2 100%   27:48 AM  47 year old male significant history of hypertension, diabetes, hyperlipidemia presenting complaining of elbow pain.  Patient report for the past week he has  had progressive pain and swelling noted to his right forearm.  Pain is throbbing and persistent with increased swelling.  He initially was seen in the ED several days ago for his complaint.  He was prescribed antibiotic and was told to return if no improvement.  He has been taking his antibiotic as prescribed but still noticing pain and swelling.  He is up-to-date with tetanus.  He denies any injury.  States he has history of diabetes and is concerned about infection.  No numbness.  On exam this is a well-appearing male resting comfortably in bed appears to be in no acute discomfort.  Right upper extremity concerning for an area of induration and fluctuant at the distal forearm on the dorsum without elbow joint involvement.  Area is tender and suggestive of a subcutaneous abscess amenable for incision and drainage.  Labs obtained and remarkable for elevated creatinine of 2.0.  It was 1.24 approximately 1 year ago.  -The  patient was maintained on a cardiac monitor.  I personally viewed and interpreted the cardiac monitored which showed an underlying rhythm of: NSR -Imaging including xray of R forearm considered but not performed as I have low suspicion for osteomyelitis  -This patient presents to the ED for concern of arm pain, this involves an extensive number of treatment options, and is a complaint that carries with it a high risk of complications and morbidity.  The differential diagnosis includes abscess, cellulitis, strain, sprain, fx, dislocation -Co morbidities that complicate the patient evaluation includes DM -Treatment includes I&D -Reevaluation of the patient after these medicines showed that the patient improved -PCP office notes or outside notes reviewed -Escalation to admission/observation considered: patients feels much better, is comfortable with discharge, and will follow up with PCP -Prescription medication considered, patient comfortable with continue with doxycycline, return  precaution given -Social Determinant of Health considered which includes tobacco use.   5:10 AM Cutaneous abscess of the right forearm was successfully incised and drained by me.  Packing was not placed.  Appropriate wound care instruction provided.  Patient to continue with the rest of his antibiotic that was previously prescribed.         Final Clinical Impression(s) / ED Diagnoses Final diagnoses:  Abscess of right forearm    Rx / DC Orders ED Discharge Orders     None         Fayrene Helper, PA-C 03/03/23 1610    Tilden Fossa, MD 03/03/23 2255

## 2023-03-03 NOTE — Discharge Instructions (Signed)
You have been evaluated for your symptoms.  You have an abscess to your right forearm that was incised and drained in the ED.  Please apply ice warm moist compress to affected area several times daily to aid with healing.  Continue to take the rest of the antibiotic as was prescribed recently.  Return to ER if you notice no improvement in the symptoms.

## 2023-03-07 ENCOUNTER — Other Ambulatory Visit (HOSPITAL_COMMUNITY): Payer: Self-pay

## 2023-03-08 ENCOUNTER — Encounter (HOSPITAL_COMMUNITY): Payer: Self-pay

## 2023-03-08 ENCOUNTER — Emergency Department (HOSPITAL_COMMUNITY)
Admission: EM | Admit: 2023-03-08 | Discharge: 2023-03-08 | Disposition: A | Discharge status: Home / Self Care | Payer: 59 | Attending: Emergency Medicine | Admitting: Emergency Medicine

## 2023-03-08 DIAGNOSIS — Z4800 Encounter for change or removal of nonsurgical wound dressing: Secondary | ICD-10-CM | POA: Insufficient documentation

## 2023-03-08 DIAGNOSIS — Z5189 Encounter for other specified aftercare: Secondary | ICD-10-CM

## 2023-03-08 MED ORDER — DOXYCYCLINE HYCLATE 100 MG PO CAPS: 100.0000 mg | ORAL_CAPSULE | Freq: Two times a day (BID) | ORAL | 0 refills | Status: AC

## 2023-03-08 MED ORDER — DOXYCYCLINE HYCLATE 100 MG PO CAPS
100.0000 mg | ORAL_CAPSULE | Freq: Two times a day (BID) | ORAL | 0 refills | Status: DC
  Filled 2023-03-08: qty 10, 5d supply, fill #0

## 2023-03-08 NOTE — ED Provider Notes (Signed)
[place] EMERGENCY DEPARTMENT AT [facility] Provider Note   CSN: [id number] Arrival date & time: 03/08/23  1140     History  Chief Complaint  Patient presents with   Wound Check    Antonio Huber is a 47 y.o. male presenting to the ED for wound recheck.  The patient had an abscess developed on the right forearm, was seen twice in the ED over the past 10 days for this.  On his most recent visit on 6/25, 5 days ago, he had a bedside I&D performed.  The patient was put on doxycycline and says has completed the course.  He denies fevers or chills.  He is concerned because he noted some yellow drainage from the wound site.  HPI     Home Medications Prior to Admission medications   Medication Sig Start Date End Date Taking? Authorizing Provider  doxycycline (VIBRAMYCIN) 100 MG capsule Take 1 capsule (100 mg total) by mouth 2 (two) times daily for 5 days. 03/08/23 03/13/23 Yes Terald Sleeper, MD  Accu-Chek Softclix Lancets lancets use as directed, check fasting glucose daily 09/27/21     atorvastatin (LIPITOR) 40 MG tablet TAKE 1 TABLET BY MOUTH DAILY 11/14/20 11/14/21  Verdene Lennert A, FNP  atorvastatin (LIPITOR) 40 MG tablet Take 1 Tablet by mouth daily. 09/16/22     atorvastatin (LIPITOR) 40 MG tablet Take 1 tablet (40 mg total) by mouth daily. 02/17/23     benzonatate (TESSALON) 100 MG capsule Take 2 capsules (200 mg total) by mouth 3 (three) times daily as needed for cough. 10/04/21   Bing Neighbors, NP  Blood Glucose Monitoring Suppl (ACCU-CHEK GUIDE) w/Device KIT use as directed to check fasting glucose daily 09/27/21     Blood Glucose Monitoring Suppl (BLOOD GLUCOSE MONITOR SYSTEM) w/Device KIT Use as needed for blood glucose monitoring 06/24/22     Cholecalciferol (VITAMIN D3) 50 MCG (2000 UT) TABS Take 1 tablet by mouth daily after a meal 01/15/22     Continuous Blood Gluc Receiver (FREESTYLE LIBRE 14 DAY READER) DEVI Use as directed, check fasting glucose  daily Patient not taking: Reported on 07/05/2021 04/01/21     cyclobenzaprine (FLEXERIL) 10 MG tablet Take 1 tablet by mouth twice daily after a meal 08/12/22     cyclobenzaprine (FLEXERIL) 10 MG tablet Take 1 tablet (10 mg total) by mouth every 12 (twelve) hours. 12/04/22     doxycycline (VIBRAMYCIN) 100 MG capsule Take 1 capsule (100 mg total) by mouth 2 (two) times daily. 01/25/23   Jeannie Fend, PA-C  fenofibrate (TRICOR) 48 MG tablet Take 1 tablet (48 mg) by mouth once daily 09/27/21     fenofibrate (TRICOR) 48 MG tablet Take 1 tablet (48 mg total) by mouth daily. 09/12/22     fenofibrate (TRICOR) 48 MG tablet Take 1 tablet (48 mg total) by mouth daily. 09/16/22     fluconazole (DIFLUCAN) 150 MG tablet Take 3 tablets (450 mg total) by mouth once a week for 26 doses. 11/12/21   McDonald, Rachelle Hora, DPM  gabapentin (NEURONTIN) 300 MG capsule Take 2 capsules (600 mg total) by mouth 3 (three) times daily. Patient taking differently: Take 300 mg by mouth 3 (three) times daily. 04/12/15   Tyrone Nine, MD  gabapentin (NEURONTIN) 300 MG capsule Take 1 capsule by mouth 3 times a day 08/12/22     glucose blood (FREESTYLE TEST STRIPS) test strip Use 1 to test glucose daily 06/24/22     glucose  blood test strip use as directed to check fasting glucose daily 09/27/21     HYDROcodone-acetaminophen (NORCO) 7.5-325 MG tablet Take 1 tablet by mouth 4 times daily as needed for pain 03/21/22     HYDROcodone-acetaminophen (NORCO) 7.5-325 MG tablet Take 1 tablet by mouth every 6 (six) hours. 04/18/22     HYDROcodone-acetaminophen (NORCO) 7.5-325 MG tablet Take 1 tablet by mouth every 6 (six) hours. 05/20/22     HYDROcodone-acetaminophen (NORCO) 7.5-325 MG tablet Take 1 tablet by mouth every 6 (six) hours. 06/12/22     HYDROcodone-acetaminophen (NORCO) 7.5-325 MG tablet Take 1 tablet by mouth every 6 (six) hours. 07/14/22     HYDROcodone-acetaminophen (NORCO) 7.5-325 MG tablet Take 1 tablet by mouth every 6 hours 08/12/22      HYDROcodone-acetaminophen (NORCO) 7.5-325 MG tablet Take 1 tablet by mouth every 6 hours 09/09/22     HYDROcodone-acetaminophen (NORCO) 7.5-325 MG tablet Take 1 tablet by mouth every 6 (six) hours. 10/09/22     HYDROcodone-acetaminophen (NORCO) 7.5-325 MG tablet Take 1 tablet by mouth every 6 (six) hours. 11/06/22     HYDROcodone-acetaminophen (NORCO) 7.5-325 MG tablet Take 1 tablet by mouth every 6 (six) hours. 12/04/22     HYDROcodone-acetaminophen (NORCO) 7.5-325 MG tablet Take 1 tablet by mouth every 6 (six) hours. 01/01/23     HYDROcodone-acetaminophen (NORCO) 7.5-325 MG tablet Take 1 tablet by mouth every 6 (six) hours. 02/26/23     losartan-hydrochlorothiazide (HYZAAR) 50-12.5 MG tablet Take 1 tablet by mouth daily. 09/11/22     losartan-hydrochlorothiazide (HYZAAR) 50-12.5 MG tablet Take 1 tablet by mouth once daily 09/16/22     losartan-hydrochlorothiazide (HYZAAR) 50-12.5 MG tablet Take 1 tablet by mouth daily. 02/17/23     meloxicam (MOBIC) 15 MG tablet Take 1 tablet by mouth once a day 07/30/21     meloxicam (MOBIC) 15 MG tablet Take 1 tablet by mouth once a day. 08/27/21     meloxicam (MOBIC) 15 MG tablet Take 1 tablet by mouth once a day. 09/27/21     meloxicam (MOBIC) 15 MG tablet Take 1 tablet by mouth once daily 10/29/21     meloxicam (MOBIC) 15 MG tablet Take 1 tablet by mouth once a day. 11/26/21     meloxicam (MOBIC) 15 MG tablet Take 1 tablet by mouth daily 12/24/21     meloxicam (MOBIC) 15 MG tablet Take 1 tablet by mouth once a day. 01/23/22     meloxicam (MOBIC) 15 MG tablet Take 1 tablet by mouth once a day. 02/21/22     meloxicam (MOBIC) 15 MG tablet Take 1 tablet by mouth once a day. 03/21/22     meloxicam (MOBIC) 15 MG tablet Take 1 tablets by mouth daily after a meal 04/18/22     meloxicam (MOBIC) 15 MG tablet Take 1 tablet (15 mg total) by mouth daily after meals 06/12/22     meloxicam (MOBIC) 15 MG tablet Take 1 tablet (15 mg total) by mouth daily after a meal for 30 days 09/09/22      meloxicam (MOBIC) 15 MG tablet Take 1 tablet (15 mg total) by mouth daily after a meal 10/09/22     meloxicam (MOBIC) 15 MG tablet Take 1 tablet (15 mg total) by mouth daily after a meal. 11/06/22     meloxicam (MOBIC) 15 MG tablet Take 1 tablet (15 mg total) by mouth daily. 12/04/22     metFORMIN (GLUCOPHAGE) 1000 MG tablet TAKE 1 TABLET BY MOUTH 2 TIMES PER DAY WITH MORNING AND  EVENING MEALS 06/21/21 06/21/22    metFORMIN (GLUCOPHAGE) 1000 MG tablet Take 1 tablet (1,000 mg total) by mouth 2 (two) times daily with morning and evening meals. 09/16/22     Multiple Vitamin (MULTIVITAMIN WITH MINERALS) TABS tablet Take 1 tablet by mouth daily.    [provider]  Naloxone HCl (KLOXXADO) 8 MG/0.1ML LIQD Use as directed when needed for opioid emergency Patient not taking: No sig reported 04/02/21     omeprazole (PRILOSEC) 40 MG capsule TAKE 1 CAPSULE BY MOUTH ONCE A DAY BEFORE A MEAL 11/14/20 01/16/22  Sherral Hammers, FNP  omeprazole (PRILOSEC) 40 MG capsule Take 1 capsule (40 mg total) by mouth daily before a meal 01/15/22     omeprazole (PRILOSEC) 40 MG capsule Take 1 capsule (40 mg total) by mouth daily before a meal 10/09/22     ondansetron (ZOFRAN) 4 MG tablet Take 1 tablet (4 mg total) by mouth every 6 (six) hours. Take 1 tablet by mouth every 6 hours if experiencing nausea or vomiting 08/24/21   Blue, Soijett A, PA-C  oseltamivir (TAMIFLU) 75 MG capsule Take 1 capsule (75 mg total) by mouth 2 (two) times daily. 10/04/21   Bing Neighbors, NP  Potassium Chloride ER 20 MEQ TBCR Take 1 tablet by mouth daily with food. 01/30/23     triamcinolone cream (KENALOG) 0.1 % Apply topically 2 times daily 09/16/22     triamterene-hydrochlorothiazide (MAXZIDE-25) 37.5-25 MG tablet TAKE 1 TABLET BY MOUTH ONCE A DAY 11/14/20 11/14/21  Verdene Lennert A, FNP  triamterene-hydrochlorothiazide (MAXZIDE-25) 37.5-25 MG tablet Take 1 tablet by mouth daily. 01/15/22         Allergies    Lisinopril, Montelukast, and  Meloxicam    Review of Systems   Review of Systems  Physical Exam Updated Vital Signs BP 114/80 (BP Location: Left Arm)   Pulse 92   Temp 98 F (36.7 C) (Oral)   Resp 16   SpO2 99%  Physical Exam Constitutional:      General: He is not in acute distress. HENT:     Head: Normocephalic and atraumatic.  Eyes:     Conjunctiva/sclera: Conjunctivae normal.     Pupils: Pupils are equal, round, and reactive to light.  Cardiovascular:     Rate and Rhythm: Normal rate and regular rhythm.  Pulmonary:     Effort: Pulmonary effort is normal. No respiratory distress.  Musculoskeletal:     Comments: Ulcerated appearance of right forearm wound with suspected granulation tissue, see photo, some mild surrounding edema without fluctuant pocket, no streaking erythema  Skin:    General: Skin is warm and dry.  Neurological:     General: No focal deficit present.     Mental Status: He is alert. Mental status is at baseline.  Psychiatric:        Mood and Affect: Mood normal.        Behavior: Behavior normal.     ED Results / Procedures / Treatments   Labs (all labs ordered are listed, but only abnormal results are displayed) Labs Reviewed - No data to display  EKG None  Radiology No results found.  Procedures Procedures    Medications Ordered in ED Medications - No data to display  ED Course/ Medical Decision Making/ A&P                             Medical Decision Making  Patient is here for a  wound recheck.  I suspect this is in fact likely healing, but explained this is a slower process as he is diabetic.  There is some surrounding edema, without erythema or fluctuance.  I did reapply an ultrasound probe and reexamine the wound, and I do not see any drainable underlying collection, but there is some continued cobblestoning near the surface suggestive of edema.  Again I think this is reasonably part of the healing process, but I think it may also be reasonable to provide an  additional 5 days of doxycycline for continued cellulitis coverage.  Advised the patient can clean with regular soap and water at home.  If he notices redness streaking up or down the arm he should return to the ED.        Final Clinical Impression(s) / ED Diagnoses Final diagnoses:  Visit for wound check    Rx / DC Orders ED Discharge Orders          Ordered    doxycycline (VIBRAMYCIN) 100 MG capsule  2 times daily        03/08/23 1213              Terald Sleeper, MD 03/08/23 1214

## 2023-03-08 NOTE — Discharge Instructions (Signed)
Your wound appears to be healing on exam today.  This process can be slow and can take another 2 to 3 weeks to complete.  We can wash with regular soap and water at home.  If you notice redness streaking up or down your arm, or see bad-smelling pus draining from the site, return to the hospital or your doctor's office.  It is normal to have some yellowing inside of these wounds as the wound heals.

## 2023-03-08 NOTE — ED Triage Notes (Signed)
Pt arrived via POV, requesting wound check, was seen twice for area and wants to verify there is no infection.

## 2023-03-09 ENCOUNTER — Other Ambulatory Visit (HOSPITAL_COMMUNITY): Payer: Self-pay

## 2023-03-26 ENCOUNTER — Other Ambulatory Visit (HOSPITAL_COMMUNITY): Payer: Self-pay

## 2023-03-26 MED ORDER — HYDROCODONE-ACETAMINOPHEN 7.5-325 MG PO TABS
1.0000 | ORAL_TABLET | Freq: Four times a day (QID) | ORAL | 0 refills | Status: DC
  Filled 2023-03-26 – 2023-03-30 (×4): qty 120, 30d supply, fill #0

## 2023-03-27 ENCOUNTER — Other Ambulatory Visit (HOSPITAL_COMMUNITY): Payer: Self-pay

## 2023-03-28 ENCOUNTER — Other Ambulatory Visit (HOSPITAL_COMMUNITY): Payer: Self-pay

## 2023-03-30 ENCOUNTER — Other Ambulatory Visit (HOSPITAL_COMMUNITY): Payer: Self-pay

## 2023-03-30 ENCOUNTER — Other Ambulatory Visit: Payer: Self-pay

## 2023-03-31 ENCOUNTER — Other Ambulatory Visit (HOSPITAL_COMMUNITY): Payer: Self-pay

## 2023-04-02 ENCOUNTER — Other Ambulatory Visit (HOSPITAL_COMMUNITY): Payer: Self-pay

## 2023-04-23 ENCOUNTER — Other Ambulatory Visit (HOSPITAL_COMMUNITY): Payer: Self-pay

## 2023-04-23 ENCOUNTER — Other Ambulatory Visit: Payer: Self-pay

## 2023-04-23 MED ORDER — KLOXXADO 8 MG/0.1ML NA LIQD
NASAL | 0 refills | Status: DC
  Filled 2024-01-13: qty 2, 1d supply, fill #0

## 2023-04-23 MED ORDER — HYDROCODONE-ACETAMINOPHEN 7.5-325 MG PO TABS
1.0000 | ORAL_TABLET | Freq: Four times a day (QID) | ORAL | 0 refills | Status: DC
  Filled 2023-04-23 – 2023-04-29 (×2): qty 120, 30d supply, fill #0

## 2023-04-28 ENCOUNTER — Other Ambulatory Visit (HOSPITAL_COMMUNITY): Payer: Self-pay

## 2023-04-29 ENCOUNTER — Other Ambulatory Visit (HOSPITAL_COMMUNITY): Payer: Self-pay

## 2023-04-29 ENCOUNTER — Other Ambulatory Visit: Payer: Self-pay

## 2023-05-01 ENCOUNTER — Other Ambulatory Visit (HOSPITAL_COMMUNITY): Payer: Self-pay

## 2023-05-04 ENCOUNTER — Other Ambulatory Visit (HOSPITAL_COMMUNITY): Payer: Self-pay

## 2023-05-06 ENCOUNTER — Other Ambulatory Visit (HOSPITAL_COMMUNITY): Payer: Self-pay

## 2023-05-22 ENCOUNTER — Other Ambulatory Visit (HOSPITAL_COMMUNITY): Payer: Self-pay

## 2023-05-22 MED ORDER — HYDROCODONE-ACETAMINOPHEN 7.5-325 MG PO TABS
1.0000 | ORAL_TABLET | Freq: Four times a day (QID) | ORAL | 0 refills | Status: DC
  Filled 2023-05-22 – 2023-05-29 (×2): qty 120, 30d supply, fill #0

## 2023-05-29 ENCOUNTER — Other Ambulatory Visit (HOSPITAL_COMMUNITY): Payer: Self-pay

## 2023-06-04 ENCOUNTER — Other Ambulatory Visit (HOSPITAL_COMMUNITY): Payer: Self-pay

## 2023-06-12 ENCOUNTER — Other Ambulatory Visit (HOSPITAL_COMMUNITY): Payer: Self-pay

## 2023-06-12 MED ORDER — DOXYCYCLINE HYCLATE 100 MG PO CAPS
100.0000 mg | ORAL_CAPSULE | Freq: Two times a day (BID) | ORAL | 0 refills | Status: DC
  Filled 2023-06-12: qty 14, 7d supply, fill #0

## 2023-06-19 ENCOUNTER — Other Ambulatory Visit (HOSPITAL_COMMUNITY): Payer: Self-pay

## 2023-06-19 MED ORDER — HYDROCODONE-ACETAMINOPHEN 7.5-325 MG PO TABS
1.0000 | ORAL_TABLET | Freq: Four times a day (QID) | ORAL | 0 refills | Status: DC
  Filled 2023-06-20 – 2023-06-27 (×2): qty 120, 30d supply, fill #0
  Filled ????-??-??: fill #0

## 2023-06-20 ENCOUNTER — Other Ambulatory Visit (HOSPITAL_COMMUNITY): Payer: Self-pay

## 2023-06-22 ENCOUNTER — Other Ambulatory Visit (HOSPITAL_COMMUNITY): Payer: Self-pay

## 2023-06-23 ENCOUNTER — Other Ambulatory Visit (HOSPITAL_COMMUNITY): Payer: Self-pay

## 2023-06-23 MED ORDER — CEPHALEXIN 500 MG PO CAPS
500.0000 mg | ORAL_CAPSULE | Freq: Four times a day (QID) | ORAL | 0 refills | Status: DC
  Filled 2023-06-23: qty 28, 7d supply, fill #0

## 2023-06-23 MED ORDER — CLINDAMYCIN PHOSPHATE 1 % EX GEL
CUTANEOUS | 1 refills | Status: AC
  Filled 2023-06-23: qty 30, 12d supply, fill #0
  Filled 2023-06-23 (×2): qty 30, 30d supply, fill #0

## 2023-06-26 ENCOUNTER — Other Ambulatory Visit (HOSPITAL_COMMUNITY): Payer: Self-pay

## 2023-06-26 ENCOUNTER — Other Ambulatory Visit: Payer: Self-pay

## 2023-06-27 ENCOUNTER — Other Ambulatory Visit (HOSPITAL_COMMUNITY): Payer: Self-pay

## 2023-06-29 ENCOUNTER — Other Ambulatory Visit: Payer: Self-pay

## 2023-06-29 ENCOUNTER — Other Ambulatory Visit (HOSPITAL_COMMUNITY): Payer: Self-pay

## 2023-07-17 ENCOUNTER — Other Ambulatory Visit (HOSPITAL_COMMUNITY): Payer: Self-pay

## 2023-07-17 MED ORDER — HYDROCODONE-ACETAMINOPHEN 7.5-325 MG PO TABS
1.0000 | ORAL_TABLET | Freq: Four times a day (QID) | ORAL | 0 refills | Status: DC
  Filled 2023-07-17 – 2023-07-25 (×3): qty 120, 30d supply, fill #0

## 2023-07-23 ENCOUNTER — Other Ambulatory Visit (HOSPITAL_COMMUNITY): Payer: Self-pay

## 2023-07-24 ENCOUNTER — Other Ambulatory Visit (HOSPITAL_COMMUNITY): Payer: Self-pay

## 2023-07-25 ENCOUNTER — Other Ambulatory Visit (HOSPITAL_COMMUNITY): Payer: Self-pay

## 2023-07-30 ENCOUNTER — Other Ambulatory Visit (HOSPITAL_COMMUNITY): Payer: Self-pay

## 2023-07-31 ENCOUNTER — Other Ambulatory Visit (HOSPITAL_COMMUNITY): Payer: Self-pay

## 2023-07-31 MED ORDER — GABAPENTIN 300 MG PO CAPS
300.0000 mg | ORAL_CAPSULE | Freq: Three times a day (TID) | ORAL | 3 refills | Status: DC
  Filled 2023-07-31: qty 270, 90d supply, fill #0
  Filled 2023-10-16: qty 270, 90d supply, fill #1
  Filled 2024-01-13: qty 270, 90d supply, fill #2
  Filled 2024-04-26: qty 270, 90d supply, fill #3

## 2023-08-11 ENCOUNTER — Other Ambulatory Visit (HOSPITAL_COMMUNITY): Payer: Self-pay

## 2023-08-21 ENCOUNTER — Other Ambulatory Visit (HOSPITAL_COMMUNITY): Payer: Self-pay

## 2023-08-21 MED ORDER — HYDROCODONE-ACETAMINOPHEN 7.5-325 MG PO TABS
1.0000 | ORAL_TABLET | Freq: Four times a day (QID) | ORAL | 0 refills | Status: DC
  Filled 2023-08-22 (×2): qty 120, 30d supply, fill #0

## 2023-08-22 ENCOUNTER — Other Ambulatory Visit (HOSPITAL_COMMUNITY): Payer: Self-pay

## 2023-08-24 ENCOUNTER — Other Ambulatory Visit (HOSPITAL_COMMUNITY): Payer: Self-pay

## 2023-08-24 MED ORDER — FENOFIBRATE 48 MG PO TABS
ORAL_TABLET | ORAL | 1 refills | Status: DC
  Filled 2023-08-24: qty 90, 90d supply, fill #0
  Filled 2023-11-19: qty 90, 90d supply, fill #1

## 2023-08-26 ENCOUNTER — Other Ambulatory Visit: Payer: Self-pay

## 2023-08-26 ENCOUNTER — Ambulatory Visit: Payer: 59 | Attending: Physician Assistant

## 2023-08-26 ENCOUNTER — Other Ambulatory Visit (HOSPITAL_COMMUNITY): Payer: Self-pay

## 2023-08-26 DIAGNOSIS — M542 Cervicalgia: Secondary | ICD-10-CM | POA: Diagnosis present

## 2023-08-26 DIAGNOSIS — M67912 Unspecified disorder of synovium and tendon, left shoulder: Secondary | ICD-10-CM | POA: Insufficient documentation
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
## 2023-08-27 ENCOUNTER — Other Ambulatory Visit (HOSPITAL_COMMUNITY): Payer: Self-pay

## 2023-08-27 MED ORDER — OMEPRAZOLE 40 MG PO CPDR
40.0000 mg | DELAYED_RELEASE_CAPSULE | Freq: Every day | ORAL | 3 refills | Status: DC
  Filled 2023-08-27 – 2023-09-21 (×2): qty 90, 90d supply, fill #0
  Filled 2023-12-16: qty 90, 90d supply, fill #1
  Filled 2024-03-14: qty 90, 90d supply, fill #2
  Filled 2024-06-03: qty 90, 90d supply, fill #3

## 2023-08-28 ENCOUNTER — Other Ambulatory Visit (HOSPITAL_COMMUNITY): Payer: Self-pay

## 2023-09-21 ENCOUNTER — Other Ambulatory Visit (HOSPITAL_COMMUNITY): Payer: Self-pay

## 2023-09-23 ENCOUNTER — Ambulatory Visit: Payer: 59 | Attending: Physician Assistant

## 2023-09-23 ENCOUNTER — Other Ambulatory Visit: Payer: Self-pay

## 2023-09-23 DIAGNOSIS — M67912 Unspecified disorder of synovium and tendon, left shoulder: Secondary | ICD-10-CM | POA: Insufficient documentation

## 2023-09-23 DIAGNOSIS — M542 Cervicalgia: Secondary | ICD-10-CM | POA: Insufficient documentation
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
## 2023-09-24 ENCOUNTER — Other Ambulatory Visit (HOSPITAL_COMMUNITY): Payer: Self-pay

## 2023-09-24 MED ORDER — HYDROCODONE-ACETAMINOPHEN 7.5-325 MG PO TABS
1.0000 | ORAL_TABLET | Freq: Four times a day (QID) | ORAL | 0 refills | Status: DC
  Filled 2023-09-24 (×2): qty 120, 30d supply, fill #0

## 2023-09-24 MED ORDER — LOSARTAN POTASSIUM-HCTZ 50-12.5 MG PO TABS
1.0000 | ORAL_TABLET | Freq: Every day | ORAL | 0 refills | Status: DC
  Filled 2023-09-24: qty 90, 90d supply, fill #0

## 2023-09-25 ENCOUNTER — Other Ambulatory Visit (HOSPITAL_COMMUNITY): Payer: Self-pay

## 2023-09-25 ENCOUNTER — Other Ambulatory Visit (HOSPITAL_BASED_OUTPATIENT_CLINIC_OR_DEPARTMENT_OTHER): Payer: Self-pay
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
## 2023-09-30 ENCOUNTER — Ambulatory Visit: Payer: 59

## 2023-09-30 DIAGNOSIS — M542 Cervicalgia: Secondary | ICD-10-CM | POA: Diagnosis not present

## 2023-09-30 DIAGNOSIS — M67912 Unspecified disorder of synovium and tendon, left shoulder: Secondary | ICD-10-CM

## 2023-10-01 ENCOUNTER — Encounter (HOSPITAL_COMMUNITY): Payer: Self-pay | Admitting: Emergency Medicine

## 2023-10-01 ENCOUNTER — Emergency Department (HOSPITAL_COMMUNITY)
Admission: EM | Admit: 2023-10-01 | Discharge: 2023-10-01 | Disposition: A | Discharge status: Home / Self Care | Payer: 59 | Attending: Emergency Medicine | Admitting: Emergency Medicine

## 2023-10-01 ENCOUNTER — Other Ambulatory Visit: Payer: Self-pay

## 2023-10-01 ENCOUNTER — Emergency Department (HOSPITAL_COMMUNITY): Payer: 59

## 2023-10-01 DIAGNOSIS — E119 Type 2 diabetes mellitus without complications: Secondary | ICD-10-CM | POA: Diagnosis not present

## 2023-10-01 DIAGNOSIS — Z79899 Other long term (current) drug therapy: Secondary | ICD-10-CM | POA: Insufficient documentation

## 2023-10-01 DIAGNOSIS — M542 Cervicalgia: Secondary | ICD-10-CM | POA: Diagnosis present

## 2023-10-01 DIAGNOSIS — I10 Essential (primary) hypertension: Secondary | ICD-10-CM | POA: Insufficient documentation

## 2023-10-01 LAB — CBC WITH DIFFERENTIAL/PLATELET
Abs Immature Granulocytes: 0.02 10*3/uL (ref 0.00–0.07)
Basophils Absolute: 0.2 10*3/uL — ABNORMAL HIGH (ref 0.0–0.1)
Basophils Relative: 2 %
Eosinophils Absolute: 0.6 10*3/uL — ABNORMAL HIGH (ref 0.0–0.5)
Eosinophils Relative: 9 %
HCT: 41.9 % (ref 39.0–52.0)
Hemoglobin: 13.3 g/dL (ref 13.0–17.0)
Immature Granulocytes: 0 %
Lymphocytes Relative: 29 %
Lymphs Abs: 2 10*3/uL (ref 0.7–4.0)
MCH: 30.6 pg (ref 26.0–34.0)
MCHC: 31.7 g/dL (ref 30.0–36.0)
MCV: 96.3 fL (ref 80.0–100.0)
Monocytes Absolute: 0.4 10*3/uL (ref 0.1–1.0)
Monocytes Relative: 6 %
Neutro Abs: 3.8 10*3/uL (ref 1.7–7.7)
Neutrophils Relative %: 54 %
Platelets: 260 10*3/uL (ref 150–400)
RBC: 4.35 MIL/uL (ref 4.22–5.81)
RDW: 12.5 % (ref 11.5–15.5)
WBC: 7 10*3/uL (ref 4.0–10.5)
nRBC: 0 % (ref 0.0–0.2)

## 2023-10-01 LAB — COMPREHENSIVE METABOLIC PANEL
ALT: 21 U/L (ref 0–44)
AST: 31 U/L (ref 15–41)
Albumin: 4.6 g/dL (ref 3.5–5.0)
Alkaline Phosphatase: 55 U/L (ref 38–126)
Anion gap: 11 (ref 5–15)
BUN: 21 mg/dL — ABNORMAL HIGH (ref 6–20)
CO2: 26 mmol/L (ref 22–32)
Calcium: 9.2 mg/dL (ref 8.9–10.3)
Chloride: 98 mmol/L (ref 98–111)
Creatinine, Ser: 0.93 mg/dL (ref 0.61–1.24)
GFR, Estimated: >60 mL/min (ref >60)
Glucose, Bld: 115 mg/dL — ABNORMAL HIGH (ref 70–99)
Potassium: 3.6 mmol/L (ref 3.5–5.1)
Sodium: 135 mmol/L (ref 135–145)
Total Bilirubin: 0.6 mg/dL (ref 0.0–1.2)
Total Protein: 8.3 g/dL — ABNORMAL HIGH (ref 6.5–8.1)

## 2023-10-01 MED ORDER — PREDNISONE 10 MG PO TABS
20.0000 mg | ORAL_TABLET | Freq: Every day | ORAL | 0 refills | Status: DC
  Filled 2023-10-01: qty 15, 7d supply, fill #0

## 2023-10-01 MED ORDER — KETOROLAC TROMETHAMINE 30 MG/ML IJ SOLN
60.0000 mg | Freq: Once | INTRAMUSCULAR | Status: AC
  Administered 2023-10-01: 60 mg via INTRAMUSCULAR
  Filled 2023-10-01: qty 2

## 2023-10-01 NOTE — ED Triage Notes (Signed)
Patient presents due to pins and needles from the neck into bilateral hands, right hand swelling and right collar bone pain. Patient is currently in physical therapy but has had the pain even before then. Symptoms began about a month ago.

## 2023-10-01 NOTE — Discharge Instructions (Signed)
Follow-up with your orthopedic doctor next week.  Call tomorrow for an appointment

## 2023-10-01 NOTE — ED Provider Notes (Signed)
[place] EMERGENCY DEPARTMENT AT [facility] Provider Note   CSN: [id number] Arrival date & time: 10/01/23  1653     History  Chief Complaint  Patient presents with   Neck Pain    Antonio Huber is a 48 y.o. male.  Patient has a history of diabetes hypertension along with chronic neck problems with cervical disc ruptured.  Patient has chronic numbness in his left arm but now he has pain and numbness in his right arm  The history is provided by the patient and medical records. No language interpreter was used.  Neck Pain Pain location:  Generalized neck Quality:  Aching Pain radiates to:  Does not radiate Pain is:  Same all the time Onset quality:  Gradual Timing:  Constant Progression:  Worsening Chronicity:  Recurrent Context: not fall   Relieved by:  Nothing Worsened by:  Nothing Associated symptoms: no chest pain and no headaches        Home Medications Prior to Admission medications   Medication Sig Start Date End Date Taking? Authorizing Provider  predniSONE (DELTASONE) 10 MG tablet Take 2 tablets (20 mg total) by mouth daily. 10/01/23  Yes Bethann Berkshire, MD  Accu-Chek Softclix Lancets lancets use as directed, check fasting glucose daily 09/27/21     atorvastatin (LIPITOR) 40 MG tablet TAKE 1 TABLET BY MOUTH DAILY 11/14/20 11/14/21  Verdene Lennert A, FNP  atorvastatin (LIPITOR) 40 MG tablet Take 1 Tablet by mouth daily. 09/16/22     atorvastatin (LIPITOR) 40 MG tablet Take 1 tablet (40 mg total) by mouth daily. 02/17/23     benzonatate (TESSALON) 100 MG capsule Take 2 capsules (200 mg total) by mouth 3 (three) times daily as needed for cough. 10/04/21   Bing Neighbors, NP  Blood Glucose Monitoring Suppl (ACCU-CHEK GUIDE) w/Device KIT use as directed to check fasting glucose daily 09/27/21     Blood Glucose Monitoring Suppl (BLOOD GLUCOSE MONITOR SYSTEM) w/Device KIT Use as needed for blood glucose monitoring 06/24/22     cephALEXin (KEFLEX) 500 MG  capsule Take 1 capsule (500 mg total) by mouth 4 (four) times daily with food for 7 days 06/23/23     Cholecalciferol (VITAMIN D3) 50 MCG (2000 UT) TABS Take 1 tablet by mouth daily after a meal 01/15/22     clindamycin (CLINDAGEL) 1 % gel Apply topically 2 (two) times daily for 10 days 06/23/23     Continuous Blood Gluc Receiver (FREESTYLE LIBRE 14 DAY READER) DEVI Use as directed, check fasting glucose daily Patient not taking: Reported on 07/05/2021 04/01/21     cyclobenzaprine (FLEXERIL) 10 MG tablet Take 1 tablet by mouth twice daily after a meal 08/12/22     cyclobenzaprine (FLEXERIL) 10 MG tablet Take 1 tablet (10 mg total) by mouth every 12 (twelve) hours. 12/04/22     doxycycline (VIBRAMYCIN) 100 MG capsule Take 1 capsule (100 mg total) by mouth 2 (two) times daily. 01/25/23   Jeannie Fend, PA-C  doxycycline (VIBRAMYCIN) 100 MG capsule Take 1 capsule (100 mg total) by mouth 2 (two) times daily with a meal for 7 days 06/12/23     fenofibrate (TRICOR) 48 MG tablet Take 1 tablet (48 mg) by mouth once daily 09/27/21     fenofibrate (TRICOR) 48 MG tablet Take 1 tablet (48 mg total) by mouth daily. 09/12/22     fenofibrate (TRICOR) 48 MG tablet Take 1 tablet (48 mg total) by mouth daily. 08/24/23     fluconazole (DIFLUCAN) 150  MG tablet Take 3 tablets (450 mg total) by mouth once a week for 26 doses. 11/12/21   McDonald, Rachelle Hora, DPM  gabapentin (NEURONTIN) 300 MG capsule Take 2 capsules (600 mg total) by mouth 3 (three) times daily. Patient taking differently: Take 300 mg by mouth 3 (three) times daily. 04/12/15   Tyrone Nine, MD  gabapentin (NEURONTIN) 300 MG capsule Take 1 capsule by mouth 3 times a day 07/31/23     glucose blood (FREESTYLE TEST STRIPS) test strip Use 1 to test glucose daily 06/24/22     glucose blood test strip use as directed to check fasting glucose daily 09/27/21     HYDROcodone-acetaminophen (NORCO) 7.5-325 MG tablet Take 1 tablet by mouth 4 times daily as needed for pain 03/21/22      HYDROcodone-acetaminophen (NORCO) 7.5-325 MG tablet Take 1 tablet by mouth every 6 (six) hours. 04/18/22     HYDROcodone-acetaminophen (NORCO) 7.5-325 MG tablet Take 1 tablet by mouth every 6 (six) hours. 05/20/22     HYDROcodone-acetaminophen (NORCO) 7.5-325 MG tablet Take 1 tablet by mouth every 6 (six) hours. 06/12/22     HYDROcodone-acetaminophen (NORCO) 7.5-325 MG tablet Take 1 tablet by mouth every 6 (six) hours. 07/14/22     HYDROcodone-acetaminophen (NORCO) 7.5-325 MG tablet Take 1 tablet by mouth every 6 hours 08/12/22     HYDROcodone-acetaminophen (NORCO) 7.5-325 MG tablet Take 1 tablet by mouth every 6 hours 09/09/22     HYDROcodone-acetaminophen (NORCO) 7.5-325 MG tablet Take 1 tablet by mouth every 6 (six) hours. 10/09/22     HYDROcodone-acetaminophen (NORCO) 7.5-325 MG tablet Take 1 tablet by mouth every 6 (six) hours. 11/06/22     HYDROcodone-acetaminophen (NORCO) 7.5-325 MG tablet Take 1 tablet by mouth every 6 (six) hours. 12/04/22     HYDROcodone-acetaminophen (NORCO) 7.5-325 MG tablet Take 1 tablet by mouth every 6 (six) hours. 01/01/23     HYDROcodone-acetaminophen (NORCO) 7.5-325 MG tablet Take 1 tablet by mouth every 6 (six) hours. 09/24/23     losartan-hydrochlorothiazide (HYZAAR) 50-12.5 MG tablet Take 1 tablet by mouth daily. 09/11/22     losartan-hydrochlorothiazide (HYZAAR) 50-12.5 MG tablet Take 1 tablet by mouth once daily 09/16/22     losartan-hydrochlorothiazide (HYZAAR) 50-12.5 MG tablet Take 1 tablet by mouth daily. 09/24/23     meloxicam (MOBIC) 15 MG tablet Take 1 tablet by mouth once a day 07/30/21     meloxicam (MOBIC) 15 MG tablet Take 1 tablet by mouth once a day. 08/27/21     meloxicam (MOBIC) 15 MG tablet Take 1 tablet by mouth once a day. 09/27/21     meloxicam (MOBIC) 15 MG tablet Take 1 tablet by mouth once daily 10/29/21     meloxicam (MOBIC) 15 MG tablet Take 1 tablet by mouth once a day. 11/26/21     meloxicam (MOBIC) 15 MG tablet Take 1 tablet by mouth daily 12/24/21      meloxicam (MOBIC) 15 MG tablet Take 1 tablet by mouth once a day. 01/23/22     meloxicam (MOBIC) 15 MG tablet Take 1 tablet by mouth once a day. 02/21/22     meloxicam (MOBIC) 15 MG tablet Take 1 tablet by mouth once a day. 03/21/22     meloxicam (MOBIC) 15 MG tablet Take 1 tablets by mouth daily after a meal 04/18/22     meloxicam (MOBIC) 15 MG tablet Take 1 tablet (15 mg total) by mouth daily after meals 06/12/22     meloxicam (MOBIC) 15 MG tablet Take  1 tablet (15 mg total) by mouth daily after a meal for 30 days 09/09/22     meloxicam (MOBIC) 15 MG tablet Take 1 tablet (15 mg total) by mouth daily after a meal 10/09/22     meloxicam (MOBIC) 15 MG tablet Take 1 tablet (15 mg total) by mouth daily after a meal. 11/06/22     meloxicam (MOBIC) 15 MG tablet Take 1 tablet (15 mg total) by mouth daily. 12/04/22     metFORMIN (GLUCOPHAGE) 1000 MG tablet TAKE 1 TABLET BY MOUTH 2 TIMES PER DAY WITH MORNING AND EVENING MEALS 06/21/21 06/21/22    metFORMIN (GLUCOPHAGE) 1000 MG tablet Take 1 tablet (1,000 mg total) by mouth 2 (two) times daily with morning and evening meals. 09/16/22     Multiple Vitamin (MULTIVITAMIN WITH MINERALS) TABS tablet Take 1 tablet by mouth daily.    [provider]  Naloxone HCl (KLOXXADO) 8 MG/0.1ML LIQD Use as directed when needed for opioid emergency Patient not taking: No sig reported 04/02/21     Naloxone HCl (KLOXXADO) 8 MG/0.1ML LIQD Use as needed for opioid ER 04/23/23     omeprazole (PRILOSEC) 40 MG capsule TAKE 1 CAPSULE BY MOUTH ONCE A DAY BEFORE A MEAL 11/14/20 01/16/22  Sherral Hammers, FNP  omeprazole (PRILOSEC) 40 MG capsule Take 1 capsule (40 mg total) by mouth daily before a meal 01/15/22     omeprazole (PRILOSEC) 40 MG capsule Take 1 capsule (40 mg total) by mouth daily before a meal 08/27/23     ondansetron (ZOFRAN) 4 MG tablet Take 1 tablet (4 mg total) by mouth every 6 (six) hours. Take 1 tablet by mouth every 6 hours if experiencing nausea or vomiting 08/24/21    Blue, Soijett A, PA-C  oseltamivir (TAMIFLU) 75 MG capsule Take 1 capsule (75 mg total) by mouth 2 (two) times daily. 10/04/21   Bing Neighbors, NP  Potassium Chloride ER 20 MEQ TBCR Take 1 tablet by mouth daily with food. 01/30/23     triamcinolone cream (KENALOG) 0.1 % Apply topically 2 times daily 09/16/22     triamterene-hydrochlorothiazide (MAXZIDE-25) 37.5-25 MG tablet TAKE 1 TABLET BY MOUTH ONCE A DAY 11/14/20 11/14/21  Verdene Lennert A, FNP  triamterene-hydrochlorothiazide (MAXZIDE-25) 37.5-25 MG tablet Take 1 tablet by mouth daily. 01/15/22         Allergies    Lisinopril, Montelukast, and Meloxicam    Review of Systems   Review of Systems  Constitutional:  Negative for appetite change and fatigue.  HENT:  Negative for congestion, ear discharge and sinus pressure.   Eyes:  Negative for discharge.  Respiratory:  Negative for cough.   Cardiovascular:  Negative for chest pain.  Gastrointestinal:  Negative for abdominal pain and diarrhea.  Genitourinary:  Negative for frequency and hematuria.  Musculoskeletal:  Positive for neck pain. Negative for back pain.  Skin:  Negative for rash.  Neurological:  Negative for seizures and headaches.       Neck pain with numbness in both arms  Psychiatric/Behavioral:  Negative for hallucinations.     Physical Exam Updated Vital Signs BP (!) 128/92   Pulse 80   Temp 98.1 F (36.7 C) (Oral)   Resp 18   SpO2 99%  Physical Exam Vitals and nursing note reviewed.  Constitutional:      Appearance: He is well-developed.  HENT:     Head: Normocephalic.     Nose: Nose normal.  Eyes:     General: No scleral icterus.  Conjunctiva/sclera: Conjunctivae normal.  Neck:     Thyroid: No thyromegaly.     Comments: Tender Cardiovascular:     Rate and Rhythm: Normal rate and regular rhythm.     Heart sounds: No murmur heard.    No friction rub. No gallop.  Pulmonary:     Breath sounds: No stridor. No wheezing or rales.  Chest:     Chest  wall: No tenderness.  Abdominal:     General: There is no distension.     Tenderness: There is no abdominal tenderness. There is no rebound.  Musculoskeletal:        General: Normal range of motion.     Cervical back: Neck supple.  Lymphadenopathy:     Cervical: No cervical adenopathy.  Skin:    Findings: No erythema or rash.  Neurological:     Mental Status: He is alert and oriented to person, place, and time.     Motor: No abnormal muscle tone.     Coordination: Coordination normal.     Comments: Minimal numbness in both hands  Psychiatric:        Behavior: Behavior normal.     ED Results / Procedures / Treatments   Labs (all labs ordered are listed, but only abnormal results are displayed) Labs Reviewed  CBC WITH DIFFERENTIAL/PLATELET - Abnormal; Notable for the following components:      Result Value   Eosinophils Absolute 0.6 (*)    Basophils Absolute 0.2 (*)    All other components within normal limits  COMPREHENSIVE METABOLIC PANEL - Abnormal; Notable for the following components:   Glucose, Bld 115 (*)    BUN 21 (*)    Total Protein 8.3 (*)    All other components within normal limits    EKG None  Radiology CT Cervical Spine Wo Contrast Result Date: 10/01/2023 CLINICAL DATA:  Cervical radiculopathy, no red flags. Pins and needles from the neck into bilateral hands. EXAM: CT CERVICAL SPINE WITHOUT CONTRAST TECHNIQUE: Multidetector CT imaging of the cervical spine was performed without intravenous contrast. Multiplanar CT image reconstructions were also generated. RADIATION DOSE REDUCTION: This exam was performed according to the departmental dose-optimization program which includes automated exposure control, adjustment of the mA and/or kV according to patient size and/or use of iterative reconstruction technique. COMPARISON:  None Available. FINDINGS: Alignment: Normal Skull base and vertebrae: No acute fracture. No primary bone lesion or focal pathologic process.  Soft tissues and spinal canal: No prevertebral fluid or swelling. No visible canal hematoma. Disc levels: Early anterior spurring at C5-6 and C6-7. no visible disc herniation, central spinal stenosis or neural foraminal narrowing. Upper chest: No acute findings Other: None IMPRESSION: No acute bony abnormality. Electronically Signed   By: Charlett Nose M.D.   On: 10/01/2023 20:10    Procedures Procedures    Medications Ordered in ED Medications  ketorolac (TORADOL) 30 MG/ML injection 60 mg (has no administration in time range)    ED Course/ Medical Decision Making/ A&P                                 Medical Decision Making Risk Prescription drug management.   Patient with radiculopathy from cervical disc.  She will be placed on prednisone for a week and if going to see his orthopedic doctor next week        Final Clinical Impression(s) / ED Diagnoses Final diagnoses:  Neck pain  Rx / DC Orders ED Discharge Orders          Ordered    predniSONE (DELTASONE) 10 MG tablet  Daily        10/01/23 2228              Bethann Berkshire, MD 10/04/23 1154

## 2023-10-01 NOTE — ED Provider Triage Note (Signed)
Emergency Medicine Provider Triage Evaluation Note  Antonio CANJURA , a 48 y.o. male  was evaluated in triage.  Pt complains of right hand swelling, HTN (on losartan), paresthesia to right hand started this week. Has pins and needles and intermittent pain in left side since December. No new trauma. Complaint with meds  MRI cervical spine 2019: Mild cervical spondylosis and degenerative disc disease, causing borderline impingement at C6-7 and C7-T1  Goes to PT for neck, arms, chronic pain since December  Review of Systems  Positive: Neck pain, right hand swelling Negative: fevers  Physical Exam  BP (!) 140/98 (BP Location: Right Arm)   Pulse (!) 107   Temp 98.6 F (37 C) (Oral)   Resp 18   SpO2 100%  Gen:   Awake, no distress   Resp:  Normal effort  MSK:   Moves extremities without difficulty  Other:  Mild right hand swelling. Equal grip strength  Medical Decision Making  Medically screening exam initiated at 5:53 PM.  Appropriate orders placed.  Creg O Zachar was informed that the remainder of the evaluation will be completed by another provider, this initial triage assessment does not replace that evaluation, and the importance of remaining in the ED until their evaluation is complete.  Labs ordered   Judithann Sheen, [place] 10/01/23 1800

## 2023-10-02 ENCOUNTER — Other Ambulatory Visit (HOSPITAL_COMMUNITY): Payer: Self-pay

## 2023-10-16 ENCOUNTER — Other Ambulatory Visit (HOSPITAL_COMMUNITY): Payer: Self-pay

## 2023-10-22 ENCOUNTER — Other Ambulatory Visit (HOSPITAL_COMMUNITY): Payer: Self-pay

## 2023-10-22 MED ORDER — HYDROCODONE-ACETAMINOPHEN 10-325 MG PO TABS
1.0000 | ORAL_TABLET | Freq: Four times a day (QID) | ORAL | 0 refills | Status: DC
  Filled 2023-10-22: qty 120, 30d supply, fill #0

## 2023-10-23 ENCOUNTER — Other Ambulatory Visit (HOSPITAL_COMMUNITY): Payer: Self-pay

## 2023-10-23 MED ORDER — DICLOFENAC SODIUM 75 MG PO TBEC
75.0000 mg | DELAYED_RELEASE_TABLET | Freq: Two times a day (BID) | ORAL | 0 refills | Status: DC
  Filled 2023-10-23: qty 60, 30d supply, fill #0

## 2023-10-23 MED ORDER — DICLOFENAC SODIUM 75 MG PO TBEC
75.0000 mg | DELAYED_RELEASE_TABLET | Freq: Two times a day (BID) | ORAL | 0 refills | Status: DC
  Filled 2023-10-23 – 2023-11-19 (×2): qty 60, 30d supply, fill #0

## 2023-10-23 MED ORDER — CYCLOBENZAPRINE HCL 10 MG PO TABS
10.0000 mg | ORAL_TABLET | Freq: Two times a day (BID) | ORAL | 0 refills | Status: DC
  Filled 2023-10-23: qty 60, 30d supply, fill #0

## 2023-10-26 ENCOUNTER — Other Ambulatory Visit: Payer: Self-pay

## 2023-10-26 ENCOUNTER — Ambulatory Visit: Payer: 59 | Attending: Physician Assistant

## 2023-10-26 DIAGNOSIS — M542 Cervicalgia: Secondary | ICD-10-CM | POA: Insufficient documentation

## 2023-10-26 DIAGNOSIS — M67912 Unspecified disorder of synovium and tendon, left shoulder: Secondary | ICD-10-CM | POA: Diagnosis present
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
## 2023-10-30 ENCOUNTER — Other Ambulatory Visit (HOSPITAL_COMMUNITY): Payer: Self-pay

## 2023-11-02 ENCOUNTER — Other Ambulatory Visit (HOSPITAL_COMMUNITY): Payer: Self-pay

## 2023-11-02 MED ORDER — METFORMIN HCL 1000 MG PO TABS
1000.0000 mg | ORAL_TABLET | Freq: Two times a day (BID) | ORAL | 0 refills | Status: DC
  Filled 2023-11-02: qty 180, 90d supply, fill #0

## 2023-11-03 ENCOUNTER — Other Ambulatory Visit (HOSPITAL_COMMUNITY): Payer: Self-pay

## 2023-11-09 ENCOUNTER — Ambulatory Visit: Payer: 59 | Attending: Physician Assistant

## 2023-11-09 ENCOUNTER — Other Ambulatory Visit: Payer: Self-pay

## 2023-11-09 DIAGNOSIS — M542 Cervicalgia: Secondary | ICD-10-CM | POA: Diagnosis present

## 2023-11-09 DIAGNOSIS — M67912 Unspecified disorder of synovium and tendon, left shoulder: Secondary | ICD-10-CM | POA: Insufficient documentation
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
## 2023-11-18 ENCOUNTER — Other Ambulatory Visit (HOSPITAL_COMMUNITY): Payer: Self-pay

## 2023-11-18 ENCOUNTER — Other Ambulatory Visit: Payer: Self-pay

## 2023-11-18 ENCOUNTER — Ambulatory Visit

## 2023-11-18 DIAGNOSIS — M67912 Unspecified disorder of synovium and tendon, left shoulder: Secondary | ICD-10-CM

## 2023-11-18 DIAGNOSIS — M542 Cervicalgia: Secondary | ICD-10-CM
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
## 2023-11-19 ENCOUNTER — Other Ambulatory Visit (HOSPITAL_COMMUNITY): Payer: Self-pay

## 2023-11-19 ENCOUNTER — Other Ambulatory Visit: Payer: Self-pay

## 2023-11-19 MED ORDER — CYCLOBENZAPRINE HCL 10 MG PO TABS
10.0000 mg | ORAL_TABLET | Freq: Two times a day (BID) | ORAL | 0 refills | Status: DC
  Filled 2023-11-19: qty 60, 30d supply, fill #0

## 2023-11-19 MED ORDER — DICLOFENAC SODIUM 75 MG PO TBEC
75.0000 mg | DELAYED_RELEASE_TABLET | Freq: Two times a day (BID) | ORAL | 0 refills | Status: DC
  Filled 2023-11-19: qty 60, 30d supply, fill #0

## 2023-11-19 MED ORDER — HYDROCODONE-ACETAMINOPHEN 10-325 MG PO TABS
1.0000 | ORAL_TABLET | Freq: Four times a day (QID) | ORAL | 0 refills | Status: DC
  Filled 2023-11-19: qty 120, 30d supply, fill #0
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
## 2023-11-23 ENCOUNTER — Ambulatory Visit

## 2023-11-23 DIAGNOSIS — M67912 Unspecified disorder of synovium and tendon, left shoulder: Secondary | ICD-10-CM | POA: Diagnosis not present

## 2023-11-23 DIAGNOSIS — M542 Cervicalgia: Secondary | ICD-10-CM

## 2023-11-26 ENCOUNTER — Other Ambulatory Visit (HOSPITAL_COMMUNITY): Payer: Self-pay

## 2023-12-16 ENCOUNTER — Other Ambulatory Visit (HOSPITAL_COMMUNITY): Payer: Self-pay

## 2023-12-17 ENCOUNTER — Other Ambulatory Visit: Payer: Self-pay

## 2023-12-17 ENCOUNTER — Other Ambulatory Visit (HOSPITAL_COMMUNITY): Payer: Self-pay

## 2023-12-17 MED ORDER — KLOXXADO 8 MG/0.1ML NA LIQD
NASAL | 0 refills | Status: DC
  Filled 2023-12-17: qty 2, 30d supply, fill #0

## 2023-12-17 MED ORDER — HYDROCODONE-ACETAMINOPHEN 10-325 MG PO TABS
1.0000 | ORAL_TABLET | Freq: Four times a day (QID) | ORAL | 0 refills | Status: DC
  Filled 2023-12-17 – 2023-12-19 (×3): qty 120, 30d supply, fill #0

## 2023-12-18 ENCOUNTER — Other Ambulatory Visit (HOSPITAL_COMMUNITY): Payer: Self-pay

## 2023-12-19 ENCOUNTER — Other Ambulatory Visit (HOSPITAL_COMMUNITY): Payer: Self-pay

## 2023-12-21 ENCOUNTER — Other Ambulatory Visit (HOSPITAL_COMMUNITY): Payer: Self-pay

## 2023-12-21 MED ORDER — LOSARTAN POTASSIUM-HCTZ 50-12.5 MG PO TABS
1.0000 | ORAL_TABLET | Freq: Every day | ORAL | 1 refills | Status: DC
  Filled 2023-12-21: qty 90, 90d supply, fill #0
  Filled 2024-03-21: qty 90, 90d supply, fill #1

## 2023-12-22 ENCOUNTER — Other Ambulatory Visit (HOSPITAL_COMMUNITY): Payer: Self-pay

## 2024-01-13 ENCOUNTER — Other Ambulatory Visit (HOSPITAL_COMMUNITY): Payer: Self-pay

## 2024-01-13 ENCOUNTER — Other Ambulatory Visit: Payer: Self-pay

## 2024-01-14 ENCOUNTER — Other Ambulatory Visit: Payer: Self-pay

## 2024-01-14 ENCOUNTER — Other Ambulatory Visit (HOSPITAL_COMMUNITY): Payer: Self-pay

## 2024-01-14 MED ORDER — HYDROCODONE-ACETAMINOPHEN 10-325 MG PO TABS
1.0000 | ORAL_TABLET | Freq: Four times a day (QID) | ORAL | 0 refills | Status: DC
  Filled 2024-01-14 – 2024-01-18 (×3): qty 120, 30d supply, fill #0

## 2024-01-14 MED ORDER — DICLOFENAC SODIUM 75 MG PO TBEC
75.0000 mg | DELAYED_RELEASE_TABLET | Freq: Two times a day (BID) | ORAL | 0 refills | Status: DC
  Filled 2024-01-16: qty 60, 30d supply, fill #0

## 2024-01-15 ENCOUNTER — Other Ambulatory Visit (HOSPITAL_COMMUNITY): Payer: Self-pay

## 2024-01-16 ENCOUNTER — Other Ambulatory Visit (HOSPITAL_COMMUNITY): Payer: Self-pay

## 2024-01-18 ENCOUNTER — Other Ambulatory Visit: Payer: Self-pay

## 2024-01-18 ENCOUNTER — Other Ambulatory Visit (HOSPITAL_COMMUNITY): Payer: Self-pay

## 2024-01-18 MED ORDER — POTASSIUM CHLORIDE ER 20 MEQ PO TBCR
1.0000 | EXTENDED_RELEASE_TABLET | Freq: Every day | ORAL | 0 refills | Status: DC
Start: 2024-01-17 — End: 2024-03-21
  Filled 2024-01-18: qty 86, 86d supply, fill #0
  Filled 2024-01-18: qty 4, 4d supply, fill #0

## 2024-02-02 ENCOUNTER — Other Ambulatory Visit (HOSPITAL_COMMUNITY): Payer: Self-pay

## 2024-02-02 MED ORDER — METFORMIN HCL 1000 MG PO TABS
ORAL_TABLET | ORAL | 0 refills | Status: DC
  Filled 2024-02-02: qty 180, 90d supply, fill #0

## 2024-02-11 ENCOUNTER — Other Ambulatory Visit (HOSPITAL_COMMUNITY): Payer: Self-pay

## 2024-02-11 MED ORDER — HYDROCODONE-ACETAMINOPHEN 10-325 MG PO TABS
1.0000 | ORAL_TABLET | Freq: Four times a day (QID) | ORAL | 0 refills | Status: DC
  Filled 2024-02-17: qty 120, 30d supply, fill #0
  Filled ????-??-??: fill #0

## 2024-02-11 MED ORDER — DICLOFENAC SODIUM 75 MG PO TBEC
75.0000 mg | DELAYED_RELEASE_TABLET | Freq: Two times a day (BID) | ORAL | 0 refills | Status: DC
  Filled 2024-02-11: qty 60, 30d supply, fill #0

## 2024-02-12 ENCOUNTER — Other Ambulatory Visit (HOSPITAL_COMMUNITY): Payer: Self-pay

## 2024-02-12 MED ORDER — FENOFIBRATE 48 MG PO TABS
48.0000 mg | ORAL_TABLET | Freq: Every day | ORAL | 3 refills | Status: DC
  Filled 2024-02-12: qty 90, 90d supply, fill #0

## 2024-02-12 MED ORDER — ATORVASTATIN CALCIUM 40 MG PO TABS
40.0000 mg | ORAL_TABLET | Freq: Every day | ORAL | 3 refills | Status: AC
  Filled 2024-02-12: qty 90, 90d supply, fill #0
  Filled 2024-05-19: qty 90, 90d supply, fill #1
  Filled 2024-07-31 (×2): qty 90, 90d supply, fill #2

## 2024-02-15 ENCOUNTER — Other Ambulatory Visit (HOSPITAL_COMMUNITY): Payer: Self-pay

## 2024-02-16 ENCOUNTER — Encounter: Payer: Self-pay | Admitting: Neurology

## 2024-02-17 ENCOUNTER — Other Ambulatory Visit (HOSPITAL_COMMUNITY): Payer: Self-pay

## 2024-03-10 ENCOUNTER — Other Ambulatory Visit (HOSPITAL_COMMUNITY): Payer: Self-pay

## 2024-03-10 MED ORDER — HYDROCODONE-ACETAMINOPHEN 10-325 MG PO TABS
1.0000 | ORAL_TABLET | Freq: Four times a day (QID) | ORAL | 0 refills | Status: DC
  Filled 2024-03-16: qty 120, 30d supply, fill #0

## 2024-03-10 MED ORDER — DICLOFENAC SODIUM 75 MG PO TBEC
75.0000 mg | DELAYED_RELEASE_TABLET | Freq: Two times a day (BID) | ORAL | 0 refills | Status: DC
  Filled 2024-03-10: qty 60, 30d supply, fill #0

## 2024-03-14 ENCOUNTER — Other Ambulatory Visit (HOSPITAL_COMMUNITY): Payer: Self-pay

## 2024-03-16 ENCOUNTER — Other Ambulatory Visit (HOSPITAL_COMMUNITY): Payer: Self-pay

## 2024-03-21 ENCOUNTER — Other Ambulatory Visit (HOSPITAL_COMMUNITY): Payer: Self-pay

## 2024-03-21 ENCOUNTER — Other Ambulatory Visit: Payer: Self-pay

## 2024-03-21 MED ORDER — POTASSIUM CHLORIDE ER 20 MEQ PO TBCR
20.0000 meq | EXTENDED_RELEASE_TABLET | Freq: Every day | ORAL | 0 refills | Status: DC
  Filled 2024-03-21 – 2024-04-09 (×2): qty 90, 90d supply, fill #0
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
## 2024-04-06 ENCOUNTER — Ambulatory Visit (INDEPENDENT_AMBULATORY_CARE_PROVIDER_SITE_OTHER): Admitting: Neurology

## 2024-04-06 ENCOUNTER — Encounter: Payer: Self-pay | Admitting: Neurology

## 2024-04-06 ENCOUNTER — Other Ambulatory Visit

## 2024-04-06 VITALS — BP 99/96 | HR 115 | Ht 66.0 in | Wt 181.0 lb

## 2024-04-06 DIAGNOSIS — Z794 Long term (current) use of insulin: Secondary | ICD-10-CM

## 2024-04-06 DIAGNOSIS — R202 Paresthesia of skin: Secondary | ICD-10-CM

## 2024-04-06 DIAGNOSIS — N182 Chronic kidney disease, stage 2 (mild): Secondary | ICD-10-CM | POA: Diagnosis not present

## 2024-04-06 DIAGNOSIS — E1122 Type 2 diabetes mellitus with diabetic chronic kidney disease: Secondary | ICD-10-CM | POA: Diagnosis not present

## 2024-04-06 NOTE — Patient Instructions (Signed)
 I saw you today because you had been told you had neuropathy. I don't see clear signs of this today on examination, but would like to investigate things further: -Blood work today -EMG - muscle and nerve test (see more information below)  Follow up with pain management as planned for pain control.  We will discuss next steps after your testing.  Please let me know if you have any questions or concerns in the meantime.  The physicians and staff at [facility] are committed to providing excellent care. You may receive a survey requesting feedback about your experience at our office. We strive to receive very good responses to the survey questions. If you feel that your experience would prevent you from giving the office a very good  response, please contact our office to try to remedy the situation. We may be reached at [phone]. Thank you for taking the time out of your busy day to complete the survey.  Venetia Potters, MD [facility]  ELECTROMYOGRAM AND NERVE CONDUCTION STUDIES (EMG/NCS) INSTRUCTIONS  How to Prepare The neurologist conducting the EMG will need to know if you have certain medical conditions. Tell the neurologist and other EMG lab personnel if you: Have a pacemaker or any other electrical medical device Take blood-thinning medications Have hemophilia, a blood-clotting disorder that causes prolonged bleeding Bathing Take a shower or bath shortly before your exam in order to remove oils from your skin. Don't apply lotions or creams before the exam.  What to Expect You'll likely be asked to change into a hospital gown for the procedure and lie down on an examination table. The following explanations can help you understand what will happen during the exam.  Electrodes. The neurologist or a technician places surface electrodes at various locations on your skin depending on where you're experiencing symptoms. Or the neurologist may insert needle electrodes at  different sites depending on your symptoms.  Sensations. The electrodes will at times transmit a tiny electrical current that you may feel as a twinge or spasm. The needle electrode may cause discomfort or pain that usually ends shortly after the needle is removed. If you are concerned about discomfort or pain, you may want to talk to the neurologist about taking a short break during the exam.  Instructions. During the needle EMG, the neurologist will assess whether there is any spontaneous electrical activity when the muscle is at rest - activity that isn't present in healthy muscle tissue - and the degree of activity when you slightly contract the muscle.  He or she will give you instructions on resting and contracting a muscle at appropriate times. Depending on what muscles and nerves the neurologist is examining, he or she may ask you to change positions during the exam.  After your EMG You may experience some temporary, minor bruising where the needle electrode was inserted into your muscle. This bruising should fade within several days. If it persists, contact your primary care doctor.   Preventing Falls at [facility] are common, often dreaded events in the lives of older people. Aside from the obvious injuries and even death that may result, fall can cause wide-ranging consequences including loss of independence, mental decline, decreased activity and mobility. Younger people are also at risk of falling, especially those with chronic illnesses and fatigue.  Ways to reduce risk for falling Examine diet and medications. Warm foods and alcohol dilate blood vessels, which can lead to dizziness when standing. Sleep aids, antidepressants and pain medications can  also increase the likelihood of a fall.  Get a vision exam. Poor vision, cataracts and glaucoma increase the chances of falling.  Check foot gear. Shoes should fit snugly and have a sturdy, nonskid sole and a broad, low heel  Participate  in a physician-approved exercise program to build and maintain muscle strength and improve balance and coordination. Programs that use ankle weights or stretch bands are excellent for muscle-strengthening. Water aerobics programs and low-impact Tai Chi programs have also been shown to improve balance and coordination.  Increase vitamin D  intake. Vitamin D  improves muscle strength and increases the amount of calcium  the body is able to absorb and deposit in bones.  How to prevent falls from common hazards Floors - Remove all loose wires, cords, and throw rugs. Minimize clutter. Make sure rugs are anchored and smooth. Keep furniture in its usual place.  Chairs -- Use chairs with straight backs, armrests and firm seats. Add firm cushions to existing pieces to add height.  Bathroom - Install grab bars and non-skid tape in the tub or shower. Use a bathtub transfer bench or a shower chair with a back support Use an elevated toilet seat and/or safety rails to assist standing from a low surface. Do not use towel racks or bathroom tissue holders to help you stand.  Lighting - Make sure halls, stairways, and entrances are well-lit. Install a night light in your bathroom or hallway. Make sure there is a light switch at the top and bottom of the staircase. Turn lights on if you get up in the middle of the night. Make sure lamps or light switches are within reach of the bed if you have to get up during the night.  Kitchen - Install non-skid rubber mats near the sink and stove. Clean spills immediately. Store frequently used utensils, pots, pans between waist and eye level. This helps prevent reaching and bending. Sit when getting things out of lower cupboards.  Living room/ Bedrooms - Place furniture with wide spaces in between, giving enough room to move around. Establish a route through the living room that gives you something to hold onto as you walk.  Stairs - Make sure treads, rails, and rugs are secure.  Install a rail on both sides of the stairs. If stairs are a threat, it might be helpful to arrange most of your activities on the lower level to reduce the number of times you must climb the stairs.  Entrances and doorways - Install metal handles on the walls adjacent to the doorknobs of all doors to make it more secure as you travel through the doorway.  Tips for maintaining balance Keep at least one hand free at all times. Try using a backpack or fanny pack to hold things rather than carrying them in your hands. Never carry objects in both hands when walking as this interferes with keeping your balance.  Attempt to swing both arms from front to back while walking. This might require a conscious effort if Parkinson's disease has diminished your movement. It will, however, help you to maintain balance and posture, and reduce fatigue.  Consciously lift your feet off of the ground when walking. Shuffling and dragging of the feet is a common culprit in losing your balance.  When trying to navigate turns, use a U technique of facing forward and making a wide turn, rather than pivoting sharply.  Try to stand with your feet shoulder-length apart. When your feet are close together for any length of time, you  increase your risk of losing your balance and falling.  Do one thing at a time. Don't try to walk and accomplish another task, such as reading or looking around. The decrease in your automatic reflexes complicates motor function, so the less distraction, the better.  Do not wear rubber or gripping soled shoes, they might catch on the floor and cause tripping.  Move slowly when changing positions. Use deliberate, concentrated movements and, if needed, use a grab bar or walking aid. Count 15 seconds between each movement. For example, when rising from a seated position, wait 15 seconds after standing to begin walking.  If balance is a continuous problem, you might want to consider a walking aid  such as a cane, walking stick, or walker. Once you've mastered walking with help, you might be ready to try it on your own again.

## 2024-04-08 ENCOUNTER — Other Ambulatory Visit (HOSPITAL_COMMUNITY): Payer: Self-pay

## 2024-04-08 MED ORDER — DICLOFENAC SODIUM 75 MG PO TBEC
75.0000 mg | DELAYED_RELEASE_TABLET | Freq: Two times a day (BID) | ORAL | 0 refills | Status: DC
  Filled 2024-04-08: qty 60, 30d supply, fill #0

## 2024-04-08 MED ORDER — HYDROCODONE-ACETAMINOPHEN 10-325 MG PO TABS
1.0000 | ORAL_TABLET | Freq: Four times a day (QID) | ORAL | 0 refills | Status: DC
  Filled 2024-04-15: qty 120, 30d supply, fill #0

## 2024-04-09 ENCOUNTER — Other Ambulatory Visit (HOSPITAL_COMMUNITY): Payer: Self-pay

## 2024-04-11 ENCOUNTER — Ambulatory Visit: Payer: Self-pay | Admitting: Neurology

## 2024-04-11 ENCOUNTER — Other Ambulatory Visit (HOSPITAL_COMMUNITY): Payer: Self-pay

## 2024-04-11 ENCOUNTER — Other Ambulatory Visit: Payer: Self-pay

## 2024-04-11 LAB — VITAMIN B12: Vitamin B-12: 644 pg/mL (ref 200–1100)

## 2024-04-11 LAB — IMMUNOFIXATION ELECTROPHORESIS
IgG (Immunoglobin G), Serum: 1569 mg/dL (ref 600–1640)
IgM, Serum: 42 mg/dL — ABNORMAL LOW (ref 50–300)
Immunoglobulin A: 254 mg/dL (ref 47–310)

## 2024-04-11 LAB — VITAMIN B1: Vitamin B1 (Thiamine): 21 nmol/L (ref 8–30)

## 2024-04-14 ENCOUNTER — Other Ambulatory Visit (HOSPITAL_COMMUNITY): Payer: Self-pay

## 2024-04-15 ENCOUNTER — Other Ambulatory Visit (HOSPITAL_COMMUNITY): Payer: Self-pay

## 2024-04-26 ENCOUNTER — Other Ambulatory Visit (HOSPITAL_COMMUNITY): Payer: Self-pay

## 2024-05-10 ENCOUNTER — Other Ambulatory Visit (HOSPITAL_COMMUNITY): Payer: Self-pay

## 2024-05-10 ENCOUNTER — Other Ambulatory Visit: Payer: Self-pay

## 2024-05-10 MED ORDER — HYDROCODONE-ACETAMINOPHEN 10-325 MG PO TABS
1.0000 | ORAL_TABLET | Freq: Four times a day (QID) | ORAL | 0 refills | Status: DC
  Filled 2024-05-10 – 2024-05-13 (×2): qty 120, 30d supply, fill #0

## 2024-05-10 MED ORDER — DICLOFENAC SODIUM 75 MG PO TBEC
75.0000 mg | DELAYED_RELEASE_TABLET | Freq: Two times a day (BID) | ORAL | 0 refills | Status: DC
  Filled 2024-05-10: qty 60, 30d supply, fill #0

## 2024-05-13 ENCOUNTER — Other Ambulatory Visit (HOSPITAL_COMMUNITY): Payer: Self-pay

## 2024-05-13 ENCOUNTER — Other Ambulatory Visit: Payer: Self-pay

## 2024-05-16 ENCOUNTER — Other Ambulatory Visit (HOSPITAL_COMMUNITY): Payer: Self-pay

## 2024-05-16 MED ORDER — METFORMIN HCL 1000 MG PO TABS
ORAL_TABLET | ORAL | 3 refills | Status: DC
  Filled 2024-05-16: qty 180, 90d supply, fill #0

## 2024-05-17 ENCOUNTER — Other Ambulatory Visit (HOSPITAL_COMMUNITY): Payer: Self-pay

## 2024-05-19 ENCOUNTER — Other Ambulatory Visit (HOSPITAL_COMMUNITY): Payer: Self-pay

## 2024-05-20 ENCOUNTER — Other Ambulatory Visit: Payer: Self-pay

## 2024-05-20 ENCOUNTER — Other Ambulatory Visit (HOSPITAL_COMMUNITY): Payer: Self-pay

## 2024-05-20 ENCOUNTER — Other Ambulatory Visit (HOSPITAL_BASED_OUTPATIENT_CLINIC_OR_DEPARTMENT_OTHER): Payer: Self-pay

## 2024-05-20 MED ORDER — LATANOPROST 0.005 % OP SOLN
1.0000 [drp] | Freq: Every evening | OPHTHALMIC | 2 refills | Status: AC
  Filled 2024-05-20 (×2): qty 2.5, 50d supply, fill #0
  Filled 2024-08-22 – 2024-08-23 (×2): qty 2.5, 50d supply, fill #1

## 2024-05-24 ENCOUNTER — Other Ambulatory Visit (HOSPITAL_COMMUNITY): Payer: Self-pay

## 2024-05-25 ENCOUNTER — Other Ambulatory Visit (HOSPITAL_COMMUNITY): Payer: Self-pay

## 2024-05-26 ENCOUNTER — Ambulatory Visit (INDEPENDENT_AMBULATORY_CARE_PROVIDER_SITE_OTHER): Admitting: Neurology

## 2024-05-26 DIAGNOSIS — R202 Paresthesia of skin: Secondary | ICD-10-CM | POA: Diagnosis not present

## 2024-05-26 NOTE — Procedures (Signed)
  [facility]  [address] [place], [address]  [place], [place] [address] Tel: [phone] Fax: [phone] Test Date:  05/26/2024  Patient: Antonio Huber. Blea DOB: [birth year] Physician: Tonita Blanch, DO  Sex: Male Height: 5' 6 Ref Phys: Venetia Potters, MD  ID#: [id number]   Technician:    History: This is a 48 year old man referred for evaluation of right leg pain.  NCV & EMG Findings: Extensive electrodiagnostic testing of the right lower extremity shows: Right sural and superficial peroneal sensory responses are within normal limits. Right peroneal and tibial motor responses are within normal limits. Right tibial H reflex study is within normal limits. There is no evidence of active or chronic motor axonal loss changes affecting any of the tested muscles.  Motor unit configuration and recruitment pattern is within normal limits.  Impression: This is a normal study of the right lower extremity.  In particular, there is no evidence of a large fiber sensorimotor polyneuropathy or lumbosacral radiculopathy.   ___________________________ Tonita Blanch, DO    Nerve Conduction Studies   Stim Site NR Peak (ms) Norm Peak (ms) O-P Amp (V) Norm O-P Amp  Right Sup Peroneal Anti Sensory (Ant Lat Mall)  32 C  12 cm    2.7 <4.6 10.0 >4  Right Sural Anti Sensory (Lat Mall)  32 C  Calf    2.5 <4.6 21.2 >4     Stim Site NR Onset (ms) Norm Onset (ms) O-P Amp (mV) Norm O-P Amp Site1 Site2 Delta-0 (ms) Dist (cm) Vel (m/s) Norm Vel (m/s)  Right Peroneal Motor (Ext Dig Brev)  32 C  Ankle    2.9 <6.0 4.6 >2.5 B Fib Ankle 8.7 41.0 47 >40  B Fib    11.6  4.4  Poplt B Fib 1.5 8.0 53 >40  Poplt    13.1  4.2         Right Tibial Motor (Abd Hall Brev)  32 C  Ankle    4.5 <6.0 8.5 >4 Knee Ankle 8.0 43.0 54 >40  Knee    12.5  7.2          Electromyography   Side Muscle Ins.Act Fibs Fasc Recrt Amp Dur Poly Activation Comment  Right AntTibialis Nml Nml Nml Nml Nml Nml Nml Nml N/A   Right Gastroc Nml Nml Nml Nml Nml Nml Nml Nml N/A  Right Flex Dig Long Nml Nml Nml Nml Nml Nml Nml Nml N/A  Right RectFemoris Nml Nml Nml Nml Nml Nml Nml Nml N/A  Right GluteusMed Nml Nml Nml Nml Nml Nml Nml Nml N/A      Waveforms:

## 2024-05-27 ENCOUNTER — Other Ambulatory Visit (HOSPITAL_COMMUNITY): Payer: Self-pay

## 2024-05-30 ENCOUNTER — Other Ambulatory Visit (HOSPITAL_COMMUNITY): Payer: Self-pay

## 2024-05-31 ENCOUNTER — Other Ambulatory Visit (HOSPITAL_COMMUNITY): Payer: Self-pay

## 2024-05-31 ENCOUNTER — Encounter (HOSPITAL_COMMUNITY): Payer: Self-pay

## 2024-06-03 ENCOUNTER — Other Ambulatory Visit (HOSPITAL_COMMUNITY): Payer: Self-pay

## 2024-06-04 ENCOUNTER — Other Ambulatory Visit (HOSPITAL_COMMUNITY): Payer: Self-pay

## 2024-06-07 ENCOUNTER — Telehealth: Payer: Self-pay | Admitting: Neurology

## 2024-06-07 ENCOUNTER — Other Ambulatory Visit (HOSPITAL_COMMUNITY): Payer: Self-pay

## 2024-06-07 NOTE — Telephone Encounter (Signed)
 Pt called in this afternoon and he stated that the pharmacy told him  that Dr.  Leigh blocked and discontinue the prescription called:  fenofibrate  (TRICOR ) 48 MG tablet . Pt stated that he needs the prescriptions . Pt would like a call back. Thanks

## 2024-06-08 ENCOUNTER — Other Ambulatory Visit (HOSPITAL_COMMUNITY): Payer: Self-pay

## 2024-06-08 MED ORDER — FENOFIBRATE 48 MG PO TABS
48.0000 mg | ORAL_TABLET | Freq: Every day | ORAL | 3 refills | Status: AC
  Filled 2024-06-08: qty 90, 90d supply, fill #0
  Filled 2024-08-27: qty 90, 90d supply, fill #1

## 2024-06-08 NOTE — Telephone Encounter (Signed)
 Called pt and informed him of Dr. Leigh answer. He understood and had already called his PCP about it.

## 2024-06-09 ENCOUNTER — Other Ambulatory Visit (HOSPITAL_COMMUNITY): Payer: Self-pay

## 2024-06-09 MED ORDER — HYDROCODONE-ACETAMINOPHEN 10-325 MG PO TABS
1.0000 | ORAL_TABLET | Freq: Four times a day (QID) | ORAL | 0 refills | Status: DC
  Filled 2024-06-09 – 2024-06-12 (×3): qty 120, 30d supply, fill #0

## 2024-06-09 MED ORDER — DICLOFENAC SODIUM 75 MG PO TBEC
75.0000 mg | DELAYED_RELEASE_TABLET | Freq: Two times a day (BID) | ORAL | 0 refills | Status: DC
  Filled 2024-06-09: qty 60, 30d supply, fill #0

## 2024-06-10 ENCOUNTER — Other Ambulatory Visit: Payer: Self-pay

## 2024-06-10 ENCOUNTER — Other Ambulatory Visit (HOSPITAL_COMMUNITY): Payer: Self-pay

## 2024-06-12 ENCOUNTER — Other Ambulatory Visit (HOSPITAL_COMMUNITY): Payer: Self-pay

## 2024-06-13 ENCOUNTER — Other Ambulatory Visit (HOSPITAL_COMMUNITY): Payer: Self-pay

## 2024-06-14 ENCOUNTER — Other Ambulatory Visit (HOSPITAL_COMMUNITY): Payer: Self-pay

## 2024-06-14 ENCOUNTER — Other Ambulatory Visit: Payer: Self-pay

## 2024-06-14 MED ORDER — LOSARTAN POTASSIUM-HCTZ 50-12.5 MG PO TABS
1.0000 | ORAL_TABLET | Freq: Every day | ORAL | 1 refills | Status: AC
  Filled 2024-06-14: qty 90, 90d supply, fill #0
  Filled 2024-09-12 – 2024-09-14 (×2): qty 90, 90d supply, fill #1

## 2024-07-04 ENCOUNTER — Other Ambulatory Visit (HOSPITAL_COMMUNITY): Payer: Self-pay

## 2024-07-04 MED ORDER — POTASSIUM CHLORIDE CRYS ER 20 MEQ PO TBCR
20.0000 meq | EXTENDED_RELEASE_TABLET | Freq: Every day | ORAL | 0 refills | Status: DC
  Filled 2024-07-04 (×2): qty 90, 90d supply, fill #0

## 2024-07-05 ENCOUNTER — Other Ambulatory Visit (HOSPITAL_COMMUNITY): Payer: Self-pay

## 2024-07-11 ENCOUNTER — Other Ambulatory Visit (HOSPITAL_COMMUNITY): Payer: Self-pay

## 2024-07-11 MED ORDER — LATANOPROST 0.005 % OP SOLN
1.0000 [drp] | Freq: Every evening | OPHTHALMIC | 6 refills | Status: AC
  Filled 2024-07-11: qty 2.5, 25d supply, fill #0
  Filled 2024-08-05: qty 2.5, 25d supply, fill #1
  Filled 2024-08-23: qty 2.5, 25d supply, fill #2
  Filled 2024-09-20: qty 2.5, 25d supply, fill #3
  Filled 2024-10-14: qty 2.5, 25d supply, fill #4

## 2024-07-12 ENCOUNTER — Other Ambulatory Visit (HOSPITAL_COMMUNITY): Payer: Self-pay

## 2024-07-12 MED ORDER — DICLOFENAC SODIUM 75 MG PO TBEC
75.0000 mg | DELAYED_RELEASE_TABLET | Freq: Two times a day (BID) | ORAL | 0 refills | Status: AC
  Filled 2024-07-12: qty 60, 30d supply, fill #0

## 2024-07-12 MED ORDER — HYDROCODONE-ACETAMINOPHEN 10-325 MG PO TABS
1.0000 | ORAL_TABLET | Freq: Four times a day (QID) | ORAL | 0 refills | Status: DC
  Filled 2024-07-12: qty 120, 30d supply, fill #0

## 2024-07-18 ENCOUNTER — Other Ambulatory Visit (HOSPITAL_COMMUNITY): Payer: Self-pay

## 2024-07-18 MED ORDER — GABAPENTIN 300 MG PO CAPS
300.0000 mg | ORAL_CAPSULE | Freq: Three times a day (TID) | ORAL | 3 refills | Status: AC
  Filled 2024-07-18: qty 270, 90d supply, fill #0
  Filled ????-??-?? (×2): fill #1

## 2024-07-19 ENCOUNTER — Other Ambulatory Visit (HOSPITAL_COMMUNITY): Payer: Self-pay

## 2024-07-19 MED ORDER — METFORMIN HCL 500 MG PO TABS
500.0000 mg | ORAL_TABLET | Freq: Two times a day (BID) | ORAL | 0 refills | Status: DC
  Filled 2024-07-19: qty 180, 90d supply, fill #0

## 2024-07-21 ENCOUNTER — Other Ambulatory Visit (HOSPITAL_COMMUNITY): Payer: Self-pay

## 2024-07-21 MED ORDER — VITAMIN D 50 MCG (2000 UT) PO TABS
2000.0000 [IU] | ORAL_TABLET | Freq: Every day | ORAL | 3 refills | Status: AC
  Filled 2024-07-21: qty 90, 90d supply, fill #0
  Filled 2024-10-10: qty 90, 90d supply, fill #1

## 2024-07-31 ENCOUNTER — Other Ambulatory Visit (HOSPITAL_COMMUNITY): Payer: Self-pay

## 2024-08-06 ENCOUNTER — Other Ambulatory Visit (HOSPITAL_COMMUNITY): Payer: Self-pay

## 2024-08-09 ENCOUNTER — Other Ambulatory Visit (HOSPITAL_COMMUNITY): Payer: Self-pay

## 2024-08-09 MED ORDER — DICLOFENAC SODIUM 75 MG PO TBEC
75.0000 mg | DELAYED_RELEASE_TABLET | Freq: Two times a day (BID) | ORAL | 0 refills | Status: AC
  Filled 2024-08-09: qty 60, 30d supply, fill #0

## 2024-08-09 MED ORDER — HYDROCODONE-ACETAMINOPHEN 10-325 MG PO TABS
1.0000 | ORAL_TABLET | Freq: Four times a day (QID) | ORAL | 0 refills | Status: DC
  Filled 2024-08-09: qty 120, 30d supply, fill #0

## 2024-08-10 ENCOUNTER — Other Ambulatory Visit (HOSPITAL_COMMUNITY): Payer: Self-pay

## 2024-08-22 ENCOUNTER — Other Ambulatory Visit: Payer: Self-pay

## 2024-08-22 ENCOUNTER — Other Ambulatory Visit (HOSPITAL_COMMUNITY): Payer: Self-pay

## 2024-08-23 ENCOUNTER — Other Ambulatory Visit (HOSPITAL_COMMUNITY): Payer: Self-pay

## 2024-08-23 ENCOUNTER — Other Ambulatory Visit: Payer: Self-pay

## 2024-08-23 MED ORDER — POTASSIUM CHLORIDE CRYS ER 20 MEQ PO TBCR
20.0000 meq | EXTENDED_RELEASE_TABLET | Freq: Every day | ORAL | 0 refills | Status: AC
  Filled 2024-08-23 – 2024-09-14 (×3): qty 90, 90d supply, fill #0

## 2024-08-24 ENCOUNTER — Other Ambulatory Visit (HOSPITAL_COMMUNITY): Payer: Self-pay

## 2024-08-27 ENCOUNTER — Other Ambulatory Visit (HOSPITAL_COMMUNITY): Payer: Self-pay

## 2024-09-05 ENCOUNTER — Other Ambulatory Visit (HOSPITAL_COMMUNITY): Payer: Self-pay

## 2024-09-05 MED ORDER — OMEPRAZOLE 40 MG PO CPDR
40.0000 mg | DELAYED_RELEASE_CAPSULE | Freq: Every day | ORAL | 3 refills | Status: AC
  Filled 2024-09-05: qty 90, 90d supply, fill #0

## 2024-09-06 ENCOUNTER — Other Ambulatory Visit (HOSPITAL_COMMUNITY): Payer: Self-pay

## 2024-09-11 ENCOUNTER — Other Ambulatory Visit (HOSPITAL_COMMUNITY): Payer: Self-pay

## 2024-09-11 ENCOUNTER — Encounter (HOSPITAL_BASED_OUTPATIENT_CLINIC_OR_DEPARTMENT_OTHER): Payer: Self-pay | Admitting: Emergency Medicine

## 2024-09-11 ENCOUNTER — Other Ambulatory Visit: Payer: Self-pay

## 2024-09-11 ENCOUNTER — Emergency Department (HOSPITAL_BASED_OUTPATIENT_CLINIC_OR_DEPARTMENT_OTHER)
Admission: EM | Admit: 2024-09-11 | Discharge: 2024-09-11 | Disposition: A | Discharge status: Home / Self Care | Attending: Emergency Medicine | Admitting: Emergency Medicine

## 2024-09-11 DIAGNOSIS — G894 Chronic pain syndrome: Secondary | ICD-10-CM | POA: Diagnosis present

## 2024-09-11 MED ORDER — OXYCODONE HCL 5 MG PO TABS
5.0000 mg | ORAL_TABLET | Freq: Four times a day (QID) | ORAL | 0 refills | Status: AC | PRN
  Filled 2024-09-11: qty 8, 2d supply, fill #0

## 2024-09-11 NOTE — ED Provider Notes (Signed)
 " [facility] EMERGENCY DEPARTMENT AT [facility] Provider Note   CSN: [id number] Arrival date & time: 09/11/24  9146     Patient presents with: Pain   Antonio Huber is a 49 y.o. male.   49 year old male with past medical history significant for chronic pain syndrome with multiple previous injuries who presents today for needing refill for his pain medication as he ran out 2 days ago.  He states a month ago he left his longtime pain management provider because he discovered fraud in the clinic where he was being charged for 3 visits a month but in reality he was only being seen once a month.  He was scheduled to see [facility] medical pain management provider today however once he went to the clinic he discovered that the provider was out sick.  His appointment has been rescheduled but he is unable to get a refill from his PCP today as they are closed.  The history is provided by the patient and medical records. No language interpreter was used.       Prior to Admission medications  Medication Sig Start Date End Date Taking? Authorizing Provider  Accu-Chek Softclix Lancets lancets use as directed, check fasting glucose daily 09/27/21     atorvastatin  (LIPITOR) 40 MG tablet Take 1 Tablet by mouth daily. 02/12/24     benzonatate  (TESSALON ) 100 MG capsule Take 2 capsules (200 mg total) by mouth 3 (three) times daily as needed for cough. 10/04/21   Arloa Suzen RAMAN, NP  Blood Glucose Monitoring Suppl (BLOOD GLUCOSE MONITOR SYSTEM) w/Device KIT Use as needed for blood glucose monitoring 06/24/22     Cholecalciferol  (VITAMIN D ) 50 MCG (2000 UT) tablet Take 1 Tablet by mouth once daily. 07/21/24     Cholecalciferol  (VITAMIN D3) 50 MCG (2000 UT) TABS Take 1 tablet by mouth daily after a meal 01/15/22     clindamycin  (CLINDAGEL ) 1 % gel Apply topically 2 (two) times daily for 10 days 06/23/23     Continuous Blood Gluc Receiver (FREESTYLE LIBRE 14 DAY READER) DEVI Use as directed, check  fasting glucose daily Patient not taking: Reported on 04/06/2024 04/01/21     cyclobenzaprine  (FLEXERIL ) 10 MG tablet Take 1 tablet (10 mg total) by mouth every 12 (twelve) hours. 12/04/22     diclofenac  (VOLTAREN ) 75 MG EC tablet Take 1 tablet (75 mg total) by mouth every 12 (twelve) hours for one month. 07/12/24     diclofenac  (VOLTAREN ) 75 MG EC tablet Take 1 tablet (75 mg total) by mouth every 12 (twelve) hours. 08/09/24     fenofibrate  (TRICOR ) 48 MG tablet Take 1 tablet (48 mg total) by mouth daily. 09/12/22     fenofibrate  (TRICOR ) 48 MG tablet Take 1 tablet (48 mg total) by mouth daily. 06/08/24     gabapentin  (NEURONTIN ) 300 MG capsule Take 1 capsule (300 mg total) by mouth 3 (three) times daily. 07/18/24     glucose blood (FREESTYLE TEST STRIPS) test strip Use 1 to test glucose daily 06/24/22     HYDROcodone -acetaminophen  (NORCO) 10-325 MG tablet Take 1 tablet by mouth every 6 (six) hours. 08/09/24     latanoprost  (XALATAN ) 0.005 % ophthalmic solution Place 1 drop into the right eye every evening. 05/20/24     latanoprost  (XALATAN ) 0.005 % ophthalmic solution Administer 1 drop into both eyes every evening. 07/11/24     losartan -hydrochlorothiazide  (HYZAAR ) 50-12.5 MG tablet Take 1 tablet by mouth daily. 06/14/24     metFORMIN  (GLUCOPHAGE ) 1000 MG  tablet TAKE 1 TABLET BY MOUTH 2 TIMES PER DAY WITH MORNING AND EVENING MEALS Patient not taking: Reported on 04/06/2024 06/21/21 06/21/22    metFORMIN  (GLUCOPHAGE ) 500 MG tablet Take 1 Tablet by mouth 2 (two) times daily with a meal. 07/19/24     Multiple Vitamin (MULTIVITAMIN WITH MINERALS) TABS tablet Take 1 tablet by mouth daily.    [provider]  omeprazole  (PRILOSEC) 40 MG capsule Take 1 capsule (40 mg total) by mouth daily before a meal. 09/05/24     potassium chloride  SA (KLOR-CON  M) 20 MEQ tablet Take 1 tablet (20 mEq total) by mouth daily. 08/23/24     triamcinolone  cream (KENALOG ) 0.1 % Apply topically 2 times daily 09/16/22        Allergies: Lisinopril, Montelukast, and Meloxicam     Review of Systems  Constitutional:  Negative for chills and fever.  Respiratory:  Negative for shortness of breath.   Musculoskeletal:  Positive for back pain. Negative for neck pain.  Neurological:  Negative for light-headedness.  All other systems reviewed and are negative.   Updated Vital Signs BP (!) 150/96 (BP Location: Right Arm)   Pulse 98   Temp 98.2 F (36.8 C) (Oral)   Resp 18   SpO2 100%   Physical Exam Vitals and nursing note reviewed.  Constitutional:      General: He is not in acute distress.    Appearance: Normal appearance. He is not ill-appearing.  HENT:     Head: Normocephalic and atraumatic.     Nose: Nose normal.  Eyes:     Conjunctiva/sclera: Conjunctivae normal.  Pulmonary:     Effort: Pulmonary effort is normal. No respiratory distress.  Musculoskeletal:        General: No deformity. Normal range of motion.     Cervical back: Normal range of motion.     Comments: Spine well aligned.  No step-offs.  Has a lumbar spine brace in place.  Good range of motion in bilateral upper and lower extremities.  Skin:    Findings: No rash.  Neurological:     Mental Status: He is alert.     (all labs ordered are listed, but only abnormal results are displayed) Labs Reviewed - No data to display  EKG: None  Radiology: No results found.   Procedures   Medications Ordered in the ED - No data to display                                  Medical Decision Making Risk Prescription drug management.   49 year old male presents today for needing a refill on his chronic pain medication. After review of PDMP it appears he does get his refill once a month.  Most recently he got this refilled on August 09, 2024 with Norco 10-325.  I advised him that we do not treat chronic pain in the emergency department.  But given his significant history of injuries and his effort of establishing a new pain  management doctor who unexpectedly called out and unable to reach his PCP will give him a short course.  I advised him that if he returns for any additional prescriptions that he will not get a refill as we do not treat chronic pain in the emergency department.  He voiced understanding.  Will give him 8 doses of Roxicodone .  Advised him that this will not be the same strength or medication as what he has  been getting. He does not appear to be in acute distress.  Will also give him lidocaine  patch.  Discussed utilizing Tylenol  1000 mg every 6 hours.  Patient is unable to take NSAIDs due to history of kidney disease.  Patient discharged in stable condition.  Return precautions discussed.  Patient voices understanding and is in agreement with plan.  Final diagnoses:  Chronic pain syndrome    ED Discharge Orders          Ordered    oxyCODONE  (ROXICODONE ) 5 MG immediate release tablet  Every 6 hours PRN        09/11/24 1003               Hildegard Loge, PA-C 09/11/24 1003    Dreama Longs, MD 09/15/24 [phone]  "

## 2024-09-11 NOTE — Discharge Instructions (Addendum)
 Short course of pain medicine sent into the pharmacy for you.  Please ensure that you secure some sort of follow-up in the next day with rates with the pain management clinic or your primary care doctor.  We will be unable to give you any additional prescriptions for pain medication through the emergency department as we do not treat chronic pain. Return for any emergent symptoms.

## 2024-09-11 NOTE — ED Triage Notes (Signed)
 Reports chronic back and shoulder pain. States pain clinic provider is not in office today Denies any new symptoms, wanting prescription refilled.

## 2024-09-12 ENCOUNTER — Other Ambulatory Visit: Payer: Self-pay

## 2024-09-12 ENCOUNTER — Other Ambulatory Visit (HOSPITAL_COMMUNITY): Payer: Self-pay

## 2024-09-14 ENCOUNTER — Other Ambulatory Visit: Payer: Self-pay

## 2024-09-14 ENCOUNTER — Other Ambulatory Visit (HOSPITAL_COMMUNITY): Payer: Self-pay

## 2024-09-17 ENCOUNTER — Emergency Department (HOSPITAL_COMMUNITY)
Admission: EM | Admit: 2024-09-17 | Discharge: 2024-09-17 | Disposition: A | Discharge status: Home / Self Care | Attending: Emergency Medicine | Admitting: Emergency Medicine

## 2024-09-17 ENCOUNTER — Other Ambulatory Visit: Payer: Self-pay

## 2024-09-17 ENCOUNTER — Other Ambulatory Visit (HOSPITAL_COMMUNITY): Payer: Self-pay

## 2024-09-17 DIAGNOSIS — M7918 Myalgia, other site: Secondary | ICD-10-CM | POA: Diagnosis not present

## 2024-09-17 DIAGNOSIS — E119 Type 2 diabetes mellitus without complications: Secondary | ICD-10-CM | POA: Diagnosis not present

## 2024-09-17 DIAGNOSIS — Z79899 Other long term (current) drug therapy: Secondary | ICD-10-CM | POA: Insufficient documentation

## 2024-09-17 DIAGNOSIS — Z7984 Long term (current) use of oral hypoglycemic drugs: Secondary | ICD-10-CM | POA: Diagnosis not present

## 2024-09-17 DIAGNOSIS — I10 Essential (primary) hypertension: Secondary | ICD-10-CM | POA: Diagnosis not present

## 2024-09-17 DIAGNOSIS — G8929 Other chronic pain: Secondary | ICD-10-CM | POA: Diagnosis present

## 2024-09-17 MED ORDER — LIDOCAINE 5 % EX PTCH: 1.0000 | MEDICATED_PATCH | CUTANEOUS | 0 refills | Status: AC

## 2024-09-17 MED ORDER — HYDROCODONE-ACETAMINOPHEN 5-325 MG PO TABS: 1.0000 | ORAL_TABLET | Freq: Four times a day (QID) | ORAL | 0 refills | Status: AC | PRN

## 2024-09-17 NOTE — ED Provider Notes (Signed)
 " [place] EMERGENCY DEPARTMENT AT [facility] Provider Note   CSN: [id number] Arrival date & time: 09/17/24  1404     Patient presents with: No chief complaint on file.   Antonio Huber is a 49 y.o. male.   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  The patient was seen in the emergency ferment for the same complaint on 09/11/2024 he states that he followed with pain management and was switching pain clinics and has initiated the process of getting established however his next appointment for initiation of care is January 20.  He denies any new falls or trauma.  He states he has a history of kidney disease and cannot take NSAIDs.  No fevers, chills, new numbness or weakness.  States that he is in the process of establishing with [facility] medical pain management.  Prior to Admission medications  Medication Sig Start Date End Date Taking? Authorizing Provider  HYDROcodone -acetaminophen  (NORCO/VICODIN) 5-325 MG tablet Take 1 tablet by mouth every 6 (six) hours as needed. 09/17/24  Yes Jerrol Agent, MD  lidocaine  (LIDODERM ) 5 % Place 1 patch onto the skin daily. Remove & Discard patch within 12 hours or as directed by MD 09/17/24  Yes Jerrol Agent, MD  Accu-Chek Softclix Lancets lancets use as directed, check fasting glucose daily 09/27/21     atorvastatin  (LIPITOR) 40 MG tablet Take 1 Tablet by mouth daily. 02/12/24     benzonatate  (TESSALON ) 100 MG capsule Take 2 capsules (200 mg total) by mouth 3 (three) times daily as needed for cough. 10/04/21   Arloa Suzen RAMAN, NP  Blood Glucose Monitoring Suppl (BLOOD GLUCOSE MONITOR SYSTEM) w/Device KIT Use as needed for blood glucose monitoring 06/24/22     Cholecalciferol  (VITAMIN D ) 50 MCG (2000 UT) tablet Take 1 Tablet by mouth once daily. 07/21/24      Cholecalciferol  (VITAMIN D3) 50 MCG (2000 UT) TABS Take 1 tablet by mouth daily after a meal 01/15/22     clindamycin  (CLINDAGEL ) 1 % gel Apply topically 2 (two) times daily for 10 days 06/23/23     Continuous Blood Gluc Receiver (FREESTYLE LIBRE 14 DAY READER) DEVI Use as directed, check fasting glucose daily Patient not taking: Reported on 04/06/2024 04/01/21     cyclobenzaprine  (FLEXERIL ) 10 MG tablet Take 1 tablet (10 mg total) by mouth every 12 (twelve) hours. 12/04/22     diclofenac  (VOLTAREN ) 75 MG EC tablet Take 1 tablet (75 mg total) by mouth every 12 (twelve) hours for one month. 07/12/24     diclofenac  (VOLTAREN ) 75 MG EC tablet Take 1 tablet (75 mg total) by mouth every 12 (twelve) hours. 08/09/24     fenofibrate  (TRICOR ) 48 MG tablet Take 1 tablet (48 mg total) by mouth daily. 09/12/22     fenofibrate  (TRICOR ) 48 MG tablet Take 1 tablet (48 mg total) by mouth daily. 06/08/24     gabapentin  (NEURONTIN ) 300 MG capsule Take 1 capsule (300 mg total) by mouth 3 (three) times daily. 07/18/24     glucose blood (FREESTYLE TEST STRIPS) test strip Use 1 to test glucose daily 06/24/22     latanoprost  (XALATAN ) 0.005 % ophthalmic solution Place 1 drop into the right eye every evening. 05/20/24     latanoprost  (XALATAN ) 0.005 % ophthalmic solution Administer 1 drop into both eyes every evening. 07/11/24  losartan -hydrochlorothiazide  (HYZAAR ) 50-12.5 MG tablet Take 1 tablet by mouth daily. 06/14/24     metFORMIN  (GLUCOPHAGE ) 1000 MG tablet TAKE 1 TABLET BY MOUTH 2 TIMES PER DAY WITH MORNING AND EVENING MEALS Patient not taking: Reported on 04/06/2024 06/21/21 06/21/22    metFORMIN  (GLUCOPHAGE ) 500 MG tablet Take 1 Tablet by mouth 2 (two) times daily with a meal. 07/19/24     Multiple Vitamin (MULTIVITAMIN WITH MINERALS) TABS tablet Take 1 tablet by mouth daily.    [provider]  omeprazole  (PRILOSEC) 40 MG capsule Take 1 capsule (40 mg total) by mouth daily before a meal. 09/05/24     oxyCODONE   (ROXICODONE ) 5 MG immediate release tablet Take 1 tablet (5 mg total) by mouth every 6 (six) hours as needed for severe pain (pain score 7-10). 09/11/24   Hildegard, Amjad, PA-C  potassium chloride  SA (KLOR-CON  M) 20 MEQ tablet Take 1 tablet (20 mEq total) by mouth daily. 08/23/24     triamcinolone  cream (KENALOG ) 0.1 % Apply topically 2 times daily 09/16/22       Allergies: Lisinopril, Montelukast, and Meloxicam     Review of Systems  All other systems reviewed and are negative.   Updated Vital Signs BP (!) 131/93 (BP Location: Left Arm)   Pulse (!) 105   Temp 98 F (36.7 C) (Oral)   Resp 16   SpO2 100%   Physical Exam Vitals and nursing note reviewed.  Constitutional:      General: He is not in acute distress. HENT:     Head: Normocephalic and atraumatic.  Eyes:     Conjunctiva/sclera: Conjunctivae normal.     Pupils: Pupils are equal, round, and reactive to light.  Neck:     Comments: Positive spurling's sign Cardiovascular:     Rate and Rhythm: Normal rate and regular rhythm.  Pulmonary:     Effort: Pulmonary effort is normal. No respiratory distress.  Abdominal:     General: There is no distension.     Tenderness: There is no guarding.  Musculoskeletal:        General: No deformity or signs of injury.     Cervical back: Normal range of motion and neck supple. No rigidity.     Comments: ROM of the R knee intact, no TTP. No midline TTP of the spine, no step offs, lumbar spine brace in place.  Skin:    Findings: No lesion or rash.  Neurological:     General: No focal deficit present.     Mental Status: He is alert. Mental status is at baseline.     (all labs ordered are listed, but only abnormal results are displayed) Labs Reviewed - No data to display  EKG: None  Radiology: No results found.   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  The patient was seen in the emergency ferment for the same complaint on 09/11/2024 he states that he followed with pain management and was switching pain clinics and has initiated the process of getting established however his next appointment for initiation of  care is January 20.  He denies any new falls or trauma.  He states he has a history of kidney disease and cannot take NSAIDs.  No fevers, chills, new numbness or weakness.  States that he is in the process of establishing with [facility] medical pain management.  On arrival, the patient was vitally stable.  Physical exam generally unremarkable.  Patient at his baseline, complaining of chronic MSK neck pain, low back pain, left shoulder pain, and right knee pain.   I explained to the patient that we do not treat chronic pain in the emergency department and I am limited by [place]  state law to treat acute pain for short course.  No acute indication for further diagnostic testing at this time as patient is at his baseline, denies any new injury, falls or trauma.  PDMP checked, patient was prescribed monthly Norco up until December. As the patient is currently not yet fully established with pain management, will prescribe a short course of opiate pain medication, advise also Tylenol  and lidocaine  patch for pain control outpatient.  Per patient, sustained renal damage after taking Meloxicam  and he cannot take.  Patient encouraged to follow-up with a primary care provider and his new pain specialist.  Pt stable for discharge at this time.      Final diagnoses:  Chronic musculoskeletal pain    ED Discharge Orders          Ordered    HYDROcodone -acetaminophen  (NORCO/VICODIN) 5-325 MG tablet  Every 6 hours PRN        09/17/24 1626    lidocaine  (LIDODERM ) 5 %  Every  24 hours        09/17/24 1626               Jerrol Agent, MD 09/17/24 1626  "

## 2024-09-17 NOTE — ED Triage Notes (Signed)
 Pt reports chronic pain , is changing pain clinics and they can't get him in until 20th, pt c/o pain in multiple locations from chronic injuries, requesting pain management that can hold him over until he transitions to the new clinic

## 2024-09-17 NOTE — Discharge Instructions (Addendum)
 Please follow-up outpatient with your pain specialist for continued management of your chronic pain.

## 2024-09-20 ENCOUNTER — Other Ambulatory Visit (HOSPITAL_COMMUNITY): Payer: Self-pay

## 2024-09-21 ENCOUNTER — Ambulatory Visit

## 2024-09-23 ENCOUNTER — Emergency Department (HOSPITAL_COMMUNITY)
Admission: EM | Admit: 2024-09-23 | Discharge: 2024-09-23 | Disposition: A | Discharge status: Home / Self Care | Attending: Emergency Medicine | Admitting: Emergency Medicine

## 2024-09-23 ENCOUNTER — Other Ambulatory Visit: Payer: Self-pay

## 2024-09-23 ENCOUNTER — Encounter (HOSPITAL_COMMUNITY): Payer: Self-pay | Admitting: *Deleted

## 2024-09-23 ENCOUNTER — Other Ambulatory Visit (HOSPITAL_COMMUNITY): Payer: Self-pay

## 2024-09-23 DIAGNOSIS — M549 Dorsalgia, unspecified: Secondary | ICD-10-CM | POA: Insufficient documentation

## 2024-09-23 DIAGNOSIS — Z7984 Long term (current) use of oral hypoglycemic drugs: Secondary | ICD-10-CM | POA: Insufficient documentation

## 2024-09-23 DIAGNOSIS — M25512 Pain in left shoulder: Secondary | ICD-10-CM | POA: Diagnosis not present

## 2024-09-23 DIAGNOSIS — G8929 Other chronic pain: Secondary | ICD-10-CM | POA: Diagnosis not present

## 2024-09-23 DIAGNOSIS — Z79899 Other long term (current) drug therapy: Secondary | ICD-10-CM | POA: Insufficient documentation

## 2024-09-23 DIAGNOSIS — M25519 Pain in unspecified shoulder: Secondary | ICD-10-CM | POA: Diagnosis present

## 2024-09-23 DIAGNOSIS — M25561 Pain in right knee: Secondary | ICD-10-CM | POA: Diagnosis not present

## 2024-09-23 MED ORDER — METHOCARBAMOL 500 MG PO TABS
500.0000 mg | ORAL_TABLET | Freq: Four times a day (QID) | ORAL | 0 refills | Status: AC | PRN
  Filled 2024-09-23: qty 16, 4d supply, fill #0

## 2024-09-23 MED ORDER — IBUPROFEN 800 MG PO TABS
800.0000 mg | ORAL_TABLET | Freq: Three times a day (TID) | ORAL | 0 refills | Status: AC | PRN
  Filled 2024-09-23: qty 12, 4d supply, fill #0

## 2024-09-23 MED ORDER — KETOROLAC TROMETHAMINE 30 MG/ML IJ SOLN
30.0000 mg | Freq: Once | INTRAMUSCULAR | Status: AC
  Administered 2024-09-23: 30 mg via INTRAMUSCULAR
  Filled 2024-09-23: qty 1

## 2024-09-23 NOTE — ED Provider Notes (Signed)
 " [place] EMERGENCY DEPARTMENT AT [facility] Provider Note   CSN: [id number] Arrival date & time: 09/23/24  1211     Patient presents with: Shoulder Pain   Antonio Huber is a 49 y.o. male who presents with ongoing chronic pain. The pain is described in multiple joints/areas, intense ache, constant in nature. The patient states that he is on disability and cannot work due to the pain. The pain is worsened with movement/activity and mildly improved with rest.  There is no associated numbness, tingling, weakness, joint instability, deformity, or loss of function.  The patient states that he has tried to get in with multiple pain management clinics without success.  Patient states that he left 1 pain management clinic to seek care at another and has been denied.  The patient states that he has an upcoming appointment with pain management in 4 days on 09/27/2024 however is out of narcotic pain medication and is requesting a refill.  The patient is in no acute distress.     Shoulder Pain      Prior to Admission medications  Medication Sig Start Date End Date Taking? Authorizing Provider  ibuprofen  (ADVIL ) 800 MG tablet Take 1 tablet (800 mg total) by mouth every 8 (eight) hours as needed for up to 4 days. 09/23/24 09/27/24 Yes Brandy Zuba L, PA  methocarbamol  (ROBAXIN ) 500 MG tablet Take 1 tablet (500 mg total) by mouth every 6 (six) hours as needed for up to 4 days for muscle spasms. 09/23/24 09/27/24 Yes Willma, Sarahbeth Cashin L, PA  Accu-Chek Softclix Lancets lancets use as directed, check fasting glucose daily 09/27/21     atorvastatin  (LIPITOR) 40 MG tablet Take 1 Tablet by mouth daily. 02/12/24     benzonatate  (TESSALON ) 100 MG capsule Take 2 capsules (200 mg total) by mouth 3 (three) times daily as needed for cough. 10/04/21   Arloa Suzen RAMAN, NP  Blood Glucose Monitoring Suppl (BLOOD GLUCOSE MONITOR SYSTEM) w/Device KIT Use as needed for blood glucose monitoring 06/24/22      Cholecalciferol  (VITAMIN D ) 50 MCG (2000 UT) tablet Take 1 Tablet by mouth once daily. 07/21/24     Cholecalciferol  (VITAMIN D3) 50 MCG (2000 UT) TABS Take 1 tablet by mouth daily after a meal 01/15/22     clindamycin  (CLINDAGEL ) 1 % gel Apply topically 2 (two) times daily for 10 days 06/23/23     Continuous Blood Gluc Receiver (FREESTYLE LIBRE 14 DAY READER) DEVI Use as directed, check fasting glucose daily Patient not taking: Reported on 04/06/2024 04/01/21     cyclobenzaprine  (FLEXERIL ) 10 MG tablet Take 1 tablet (10 mg total) by mouth every 12 (twelve) hours. 12/04/22     diclofenac  (VOLTAREN ) 75 MG EC tablet Take 1 tablet (75 mg total) by mouth every 12 (twelve) hours for one month. 07/12/24     diclofenac  (VOLTAREN ) 75 MG EC tablet Take 1 tablet (75 mg total) by mouth every 12 (twelve) hours. 08/09/24     fenofibrate  (TRICOR ) 48 MG tablet Take 1 tablet (48 mg total) by mouth daily. 09/12/22     fenofibrate  (TRICOR ) 48 MG tablet Take 1 tablet (48 mg total) by mouth daily. 06/08/24     gabapentin  (NEURONTIN ) 300 MG capsule Take 1 capsule (300 mg total) by mouth 3 (three) times daily. 07/18/24     glucose blood (FREESTYLE TEST STRIPS) test strip Use 1 to test glucose daily 06/24/22     HYDROcodone -acetaminophen  (NORCO/VICODIN) 5-325 MG tablet Take 1 tablet by mouth  every 6 (six) hours as needed. 09/17/24   Jerrol Agent, MD  latanoprost  (XALATAN ) 0.005 % ophthalmic solution Place 1 drop into the right eye every evening. 05/20/24     latanoprost  (XALATAN ) 0.005 % ophthalmic solution Administer 1 drop into both eyes every evening. 07/11/24     lidocaine  (LIDODERM ) 5 % Place 1 patch onto the skin daily. Remove & Discard patch within 12 hours or as directed by MD 09/17/24   Jerrol Agent, MD  losartan -hydrochlorothiazide  (HYZAAR ) 50-12.5 MG tablet Take 1 tablet by mouth daily. 06/14/24     metFORMIN  (GLUCOPHAGE ) 1000 MG tablet TAKE 1 TABLET BY MOUTH 2 TIMES PER DAY WITH MORNING AND EVENING MEALS Patient not  taking: Reported on 04/06/2024 06/21/21 06/21/22    metFORMIN  (GLUCOPHAGE ) 500 MG tablet Take 1 Tablet by mouth 2 (two) times daily with a meal. 07/19/24     Multiple Vitamin (MULTIVITAMIN WITH MINERALS) TABS tablet Take 1 tablet by mouth daily.    [provider]  omeprazole  (PRILOSEC) 40 MG capsule Take 1 capsule (40 mg total) by mouth daily before a meal. 09/05/24     oxyCODONE  (ROXICODONE ) 5 MG immediate release tablet Take 1 tablet (5 mg total) by mouth every 6 (six) hours as needed for severe pain (pain score 7-10). 09/11/24   Hildegard, Amjad, PA-C  potassium chloride  SA (KLOR-CON  M) 20 MEQ tablet Take 1 tablet (20 mEq total) by mouth daily. 08/23/24     triamcinolone  cream (KENALOG ) 0.1 % Apply topically 2 times daily 09/16/22       Allergies: Lisinopril, Montelukast, and Meloxicam     Review of Systems  Musculoskeletal:        Pain    Updated Vital Signs BP 135/87   Pulse 81   Temp 98.1 F (36.7 C) (Oral)   Resp 18   Wt 82.1 kg   SpO2 98%   BMI 29.21 kg/m   Physical Exam Vitals and nursing note reviewed.  Constitutional:      General: He is not in acute distress.    Appearance: Normal appearance.  HENT:     Head: Normocephalic and atraumatic.  Eyes:     Extraocular Movements: Extraocular movements intact.     Conjunctiva/sclera: Conjunctivae normal.     Pupils: Pupils are equal, round, and reactive to light.  Cardiovascular:     Rate and Rhythm: Normal rate and regular rhythm.     Pulses: Normal pulses.  Pulmonary:     Effort: Pulmonary effort is normal. No respiratory distress.  Musculoskeletal:        General: Normal range of motion.     Cervical back: Normal range of motion.     Comments: Reports multiple areas of chronic pain including right knee, left shoulder, and back.  Pain is not reproducible with palpation however is with movement.  Neurovascular intact.  There is no swelling, erythema, joint instability, or deformity.  Skin:    General: Skin is warm  and dry.     Capillary Refill: Capillary refill takes less than 2 seconds.  Neurological:     General: No focal deficit present.     Mental Status: He is alert. Mental status is at baseline.  Psychiatric:        Mood and Affect: Mood normal.     (all labs ordered are listed, but only abnormal results are displayed) Labs Reviewed - No data to display  EKG: None  Radiology: No results found.   Procedures   Medications Ordered in the ED  ketorolac  (TORADOL ) 30 MG/ML injection 30 mg (30 mg Intramuscular Given 09/23/24 1436)                                 Medical Decision Making Risk Prescription drug management.   Patient presents to the ED for: Chronic pain This involves an extensive number of treatment options Differential diagnosis includes: Chronic, MSK etiology Co-morbid conditions: Diabetes, hypertension, chronic pain  Additional history/records obtained and reviewed: Additional history obtained from  outside medical records External records from outside source obtained and reviewed including previous ED visits for chronic pain and narcotic pain medication refill  Clinical Course as of 09/23/24 1549  Fri Sep 23, 2024  1426 Temp: 98.1 F (36.7 C) Afebrile, vital stable, patient no acute distress [ML]  1457 Patient given Toradol  IM for symptomatic relief  [ML]    Clinical Course User Index [ML] Willma Bouchard L, PA    Management / Treatments: See ED course above for medications, treatments administered, and clinical rationale.   Reevaluation of the patient after these medicines showed that the patient improved. I have reviewed the patients home medicines and have made adjustments as needed  ED Course / Reassessments: Problem List:  49 year old male presented for chronic pain and medication refill. Initial assessment included history, physical exam, and review of prior medical records.  Patient was educated during this visit that we do not manage chronic pain  in the emergency department, and given that he has an appointment with his pain management team in 4 days, his narcotic pain medication would not be refilled.  Shared decision making was completed on alternative regimens for patient's pain.  It was ultimately decided that patient would receive a single dose of Ketorolac  IM and be prescribed a short course of muscle relaxers and ibuprofen  800 for symptomatic relief until he is able to see pain management in 4 days.  It was reviewed in patient's chart that he has an allergy to meloxicam  given prior kidney damage - prior to administering ketorolac  and short 4 day course of ibuprofen , creatinine that was obtained on 07/19/24 was reviewed and found to be WNL - due to this, patient is safe to have a short 4 day course of inflammatories.  Patient cleared for discharge and given strict return precautions for any new or worsening symptoms.  Disposition: Disposition: Discharge with close follow-up with PCP for further evaluation and care Rationale for disposition: Stable for discharge The disposition plan and rationale were discussed with the patient at the bedside, all questions were addressed, and the patient demonstrated understanding.  This note was produced using Electronics Engineer. While I have reviewed and verified all clinical information, transcription errors may remain.      Final diagnoses:  Other chronic pain    ED Discharge Orders          Ordered    methocarbamol  (ROBAXIN ) 500 MG tablet  Every 6 hours PRN        09/23/24 1501    ibuprofen  (ADVIL ) 800 MG tablet  Every 8 hours PRN        09/23/24 1501               Camelia Stelzner L, [place] 09/23/24 1552    Charlyn Sora, MD 09/24/24 1122  "

## 2024-09-23 NOTE — Discharge Instructions (Signed)
 Thank you for visiting the Emergency Department today. It was a pleasure to be part of your healthcare team.   Your were seen today for chronic pain  As discussed, rest, hydrate, resume diet as tolerated, and utilize ibuprofen , Tylenol , muscle relaxers as needed for pain relief until you are able to follow-up at your pain clinic in 4 days. You should take your medications as directed. If you have any questions about your medicines, please call your pharmacy or healthcare provider.  Thank you for trusting us  with your health.

## 2024-09-23 NOTE — ED Triage Notes (Addendum)
 Here by POV from home for chronic pain of multiple sites. Rates 10/10. States he is in between pain management doctors and getting the run around. Alert, NAD, calm, interactive, resps e/u, speaking in clear complete sentences. Skin W&D. Steady gait.

## 2024-09-27 ENCOUNTER — Other Ambulatory Visit (HOSPITAL_COMMUNITY): Payer: Self-pay

## 2024-09-27 MED ORDER — NALOXONE HCL 4 MG/0.1ML NA LIQD
NASAL | 1 refills | Status: AC
  Filled 2024-09-27: qty 2, 2d supply, fill #0

## 2024-09-27 MED ORDER — HYDROCODONE-ACETAMINOPHEN 10-325 MG PO TABS
1.0000 | ORAL_TABLET | Freq: Four times a day (QID) | ORAL | 0 refills | Status: DC | PRN
  Filled 2024-09-27 (×2): qty 28, 7d supply, fill #0

## 2024-09-28 ENCOUNTER — Other Ambulatory Visit: Payer: Self-pay

## 2024-09-28 ENCOUNTER — Other Ambulatory Visit (HOSPITAL_BASED_OUTPATIENT_CLINIC_OR_DEPARTMENT_OTHER): Payer: Self-pay

## 2024-09-29 ENCOUNTER — Other Ambulatory Visit (HOSPITAL_COMMUNITY): Payer: Self-pay

## 2024-09-29 ENCOUNTER — Other Ambulatory Visit: Payer: Self-pay

## 2024-09-29 MED ORDER — HYDROCODONE-ACETAMINOPHEN 10-325 MG PO TABS
1.0000 | ORAL_TABLET | Freq: Four times a day (QID) | ORAL | 0 refills | Status: DC | PRN
  Filled 2024-09-29 – 2024-10-03 (×4): qty 56, 14d supply, fill #0

## 2024-10-02 ENCOUNTER — Other Ambulatory Visit (HOSPITAL_COMMUNITY): Payer: Self-pay

## 2024-10-02 ENCOUNTER — Other Ambulatory Visit: Payer: Self-pay

## 2024-10-03 ENCOUNTER — Other Ambulatory Visit: Payer: Self-pay

## 2024-10-03 ENCOUNTER — Other Ambulatory Visit (HOSPITAL_COMMUNITY): Payer: Self-pay

## 2024-10-10 ENCOUNTER — Other Ambulatory Visit (HOSPITAL_COMMUNITY): Payer: Self-pay

## 2024-10-10 MED ORDER — METFORMIN HCL 500 MG PO TABS
500.0000 mg | ORAL_TABLET | Freq: Two times a day (BID) | ORAL | 0 refills | Status: AC
  Filled 2024-10-10: qty 180, 90d supply, fill #0

## 2024-10-11 ENCOUNTER — Other Ambulatory Visit (HOSPITAL_COMMUNITY): Payer: Self-pay

## 2024-10-12 ENCOUNTER — Other Ambulatory Visit (HOSPITAL_COMMUNITY): Payer: Self-pay

## 2024-10-12 MED ORDER — CYCLOBENZAPRINE HCL 10 MG PO TABS
10.0000 mg | ORAL_TABLET | Freq: Two times a day (BID) | ORAL | 0 refills | Status: AC | PRN
  Filled 2024-10-12: qty 30, 15d supply, fill #0

## 2024-10-12 MED ORDER — HYDROCODONE-ACETAMINOPHEN 10-325 MG PO TABS
1.0000 | ORAL_TABLET | Freq: Four times a day (QID) | ORAL | 0 refills | Status: AC | PRN
  Filled 2024-10-14: qty 60, 15d supply, fill #0
  Filled ????-??-?? (×2): fill #0

## 2024-10-12 MED ORDER — NALOXONE HCL 4 MG/0.1ML NA LIQD
1.0000 | NASAL | 4 refills | Status: AC
  Filled 2024-10-12: qty 2, 2d supply, fill #0

## 2024-10-13 ENCOUNTER — Other Ambulatory Visit (HOSPITAL_COMMUNITY): Payer: Self-pay

## 2024-10-14 ENCOUNTER — Other Ambulatory Visit (HOSPITAL_COMMUNITY): Payer: Self-pay

## 2024-10-14 ENCOUNTER — Other Ambulatory Visit: Payer: Self-pay
